# Patient Record
Sex: Male | Born: 1940 | Race: White | Hispanic: No | Marital: Married | Smoking: Never smoker
Health system: Southern US, Community
[De-identification: ages and names within clinical notes are randomized; demographics above are authoritative.]

## PROBLEM LIST (undated history)

## (undated) DIAGNOSIS — I2510 Atherosclerotic heart disease of native coronary artery without angina pectoris: Secondary | ICD-10-CM

## (undated) DIAGNOSIS — E7800 Pure hypercholesterolemia, unspecified: Secondary | ICD-10-CM

## (undated) DIAGNOSIS — E119 Type 2 diabetes mellitus without complications: Secondary | ICD-10-CM

## (undated) DIAGNOSIS — I10 Essential (primary) hypertension: Secondary | ICD-10-CM

## (undated) HISTORY — PX: TONSILLECTOMY: SUR1361

## (undated) HISTORY — PX: OTHER SURGICAL HISTORY: SHX169

---

## 2014-10-19 DIAGNOSIS — I10 Essential (primary) hypertension: Secondary | ICD-10-CM | POA: Diagnosis not present

## 2014-10-19 DIAGNOSIS — I2510 Atherosclerotic heart disease of native coronary artery without angina pectoris: Secondary | ICD-10-CM | POA: Diagnosis not present

## 2014-10-19 DIAGNOSIS — E785 Hyperlipidemia, unspecified: Secondary | ICD-10-CM | POA: Diagnosis not present

## 2014-10-19 DIAGNOSIS — E119 Type 2 diabetes mellitus without complications: Secondary | ICD-10-CM | POA: Diagnosis not present

## 2014-10-19 DIAGNOSIS — E1140 Type 2 diabetes mellitus with diabetic neuropathy, unspecified: Secondary | ICD-10-CM | POA: Diagnosis not present

## 2014-10-19 DIAGNOSIS — Z23 Encounter for immunization: Secondary | ICD-10-CM | POA: Diagnosis not present

## 2015-04-09 DIAGNOSIS — I517 Cardiomegaly: Secondary | ICD-10-CM | POA: Diagnosis not present

## 2015-04-09 DIAGNOSIS — I081 Rheumatic disorders of both mitral and tricuspid valves: Secondary | ICD-10-CM | POA: Diagnosis not present

## 2015-04-09 DIAGNOSIS — R0602 Shortness of breath: Secondary | ICD-10-CM | POA: Diagnosis not present

## 2015-04-09 DIAGNOSIS — E780 Pure hypercholesterolemia: Secondary | ICD-10-CM | POA: Diagnosis not present

## 2015-04-09 DIAGNOSIS — E785 Hyperlipidemia, unspecified: Secondary | ICD-10-CM | POA: Diagnosis not present

## 2015-04-09 DIAGNOSIS — I2510 Atherosclerotic heart disease of native coronary artery without angina pectoris: Secondary | ICD-10-CM | POA: Diagnosis not present

## 2015-04-16 DIAGNOSIS — E1140 Type 2 diabetes mellitus with diabetic neuropathy, unspecified: Secondary | ICD-10-CM | POA: Diagnosis not present

## 2015-04-16 DIAGNOSIS — Z951 Presence of aortocoronary bypass graft: Secondary | ICD-10-CM | POA: Diagnosis not present

## 2015-04-16 DIAGNOSIS — I10 Essential (primary) hypertension: Secondary | ICD-10-CM | POA: Diagnosis not present

## 2015-04-16 DIAGNOSIS — I2510 Atherosclerotic heart disease of native coronary artery without angina pectoris: Secondary | ICD-10-CM | POA: Diagnosis not present

## 2015-04-16 DIAGNOSIS — E669 Obesity, unspecified: Secondary | ICD-10-CM | POA: Diagnosis not present

## 2015-04-17 DIAGNOSIS — Z961 Presence of intraocular lens: Secondary | ICD-10-CM | POA: Diagnosis not present

## 2015-04-17 DIAGNOSIS — H01022 Squamous blepharitis right lower eyelid: Secondary | ICD-10-CM | POA: Diagnosis not present

## 2015-04-17 DIAGNOSIS — H01025 Squamous blepharitis left lower eyelid: Secondary | ICD-10-CM | POA: Diagnosis not present

## 2015-04-17 DIAGNOSIS — E119 Type 2 diabetes mellitus without complications: Secondary | ICD-10-CM | POA: Diagnosis not present

## 2015-06-17 DIAGNOSIS — Z1211 Encounter for screening for malignant neoplasm of colon: Secondary | ICD-10-CM | POA: Diagnosis not present

## 2015-07-25 DIAGNOSIS — Z23 Encounter for immunization: Secondary | ICD-10-CM | POA: Diagnosis not present

## 2015-07-25 DIAGNOSIS — E785 Hyperlipidemia, unspecified: Secondary | ICD-10-CM | POA: Diagnosis not present

## 2015-07-25 DIAGNOSIS — I10 Essential (primary) hypertension: Secondary | ICD-10-CM | POA: Diagnosis not present

## 2015-07-25 DIAGNOSIS — I2510 Atherosclerotic heart disease of native coronary artery without angina pectoris: Secondary | ICD-10-CM | POA: Diagnosis not present

## 2015-07-25 DIAGNOSIS — E119 Type 2 diabetes mellitus without complications: Secondary | ICD-10-CM | POA: Diagnosis not present

## 2015-11-26 DIAGNOSIS — I10 Essential (primary) hypertension: Secondary | ICD-10-CM | POA: Diagnosis not present

## 2015-11-26 DIAGNOSIS — E785 Hyperlipidemia, unspecified: Secondary | ICD-10-CM | POA: Diagnosis not present

## 2015-11-26 DIAGNOSIS — I2510 Atherosclerotic heart disease of native coronary artery without angina pectoris: Secondary | ICD-10-CM | POA: Diagnosis not present

## 2015-11-26 DIAGNOSIS — E119 Type 2 diabetes mellitus without complications: Secondary | ICD-10-CM | POA: Diagnosis not present

## 2016-03-23 DIAGNOSIS — I2510 Atherosclerotic heart disease of native coronary artery without angina pectoris: Secondary | ICD-10-CM | POA: Diagnosis not present

## 2016-03-23 DIAGNOSIS — E1140 Type 2 diabetes mellitus with diabetic neuropathy, unspecified: Secondary | ICD-10-CM | POA: Diagnosis not present

## 2016-03-23 DIAGNOSIS — E785 Hyperlipidemia, unspecified: Secondary | ICD-10-CM | POA: Diagnosis not present

## 2016-03-23 DIAGNOSIS — E119 Type 2 diabetes mellitus without complications: Secondary | ICD-10-CM | POA: Diagnosis not present

## 2016-03-23 DIAGNOSIS — I10 Essential (primary) hypertension: Secondary | ICD-10-CM | POA: Diagnosis not present

## 2016-04-24 DIAGNOSIS — I088 Other rheumatic multiple valve diseases: Secondary | ICD-10-CM | POA: Diagnosis not present

## 2016-04-24 DIAGNOSIS — E785 Hyperlipidemia, unspecified: Secondary | ICD-10-CM | POA: Diagnosis not present

## 2016-04-24 DIAGNOSIS — Q211 Atrial septal defect: Secondary | ICD-10-CM | POA: Diagnosis not present

## 2016-04-24 DIAGNOSIS — I517 Cardiomegaly: Secondary | ICD-10-CM | POA: Diagnosis not present

## 2016-04-24 DIAGNOSIS — I2510 Atherosclerotic heart disease of native coronary artery without angina pectoris: Secondary | ICD-10-CM | POA: Diagnosis not present

## 2016-04-24 DIAGNOSIS — I10 Essential (primary) hypertension: Secondary | ICD-10-CM | POA: Diagnosis not present

## 2016-05-13 DIAGNOSIS — S233XXA Sprain of ligaments of thoracic spine, initial encounter: Secondary | ICD-10-CM | POA: Diagnosis not present

## 2016-09-04 DIAGNOSIS — Z0000 Encounter for general adult medical examination without abnormal findings: Secondary | ICD-10-CM | POA: Diagnosis not present

## 2016-09-04 DIAGNOSIS — E1142 Type 2 diabetes mellitus with diabetic polyneuropathy: Secondary | ICD-10-CM | POA: Diagnosis not present

## 2016-09-04 DIAGNOSIS — Z794 Long term (current) use of insulin: Secondary | ICD-10-CM | POA: Diagnosis not present

## 2016-09-04 DIAGNOSIS — E7800 Pure hypercholesterolemia, unspecified: Secondary | ICD-10-CM | POA: Diagnosis not present

## 2016-09-04 DIAGNOSIS — I2510 Atherosclerotic heart disease of native coronary artery without angina pectoris: Secondary | ICD-10-CM | POA: Diagnosis not present

## 2016-09-04 DIAGNOSIS — I10 Essential (primary) hypertension: Secondary | ICD-10-CM | POA: Diagnosis not present

## 2016-11-18 DIAGNOSIS — R1030 Lower abdominal pain, unspecified: Secondary | ICD-10-CM | POA: Diagnosis not present

## 2016-11-18 DIAGNOSIS — N451 Epididymitis: Secondary | ICD-10-CM | POA: Diagnosis not present

## 2016-12-21 DIAGNOSIS — N451 Epididymitis: Secondary | ICD-10-CM | POA: Diagnosis not present

## 2016-12-23 DIAGNOSIS — N509 Disorder of male genital organs, unspecified: Secondary | ICD-10-CM | POA: Diagnosis not present

## 2017-03-04 DIAGNOSIS — E7800 Pure hypercholesterolemia, unspecified: Secondary | ICD-10-CM | POA: Diagnosis not present

## 2017-03-04 DIAGNOSIS — Z7984 Long term (current) use of oral hypoglycemic drugs: Secondary | ICD-10-CM | POA: Diagnosis not present

## 2017-03-04 DIAGNOSIS — I10 Essential (primary) hypertension: Secondary | ICD-10-CM | POA: Diagnosis not present

## 2017-03-04 DIAGNOSIS — E1142 Type 2 diabetes mellitus with diabetic polyneuropathy: Secondary | ICD-10-CM | POA: Diagnosis not present

## 2017-03-04 DIAGNOSIS — I2510 Atherosclerotic heart disease of native coronary artery without angina pectoris: Secondary | ICD-10-CM | POA: Diagnosis not present

## 2017-04-23 DIAGNOSIS — E785 Hyperlipidemia, unspecified: Secondary | ICD-10-CM | POA: Diagnosis not present

## 2017-04-23 DIAGNOSIS — E1142 Type 2 diabetes mellitus with diabetic polyneuropathy: Secondary | ICD-10-CM | POA: Diagnosis not present

## 2017-04-23 DIAGNOSIS — E668 Other obesity: Secondary | ICD-10-CM | POA: Diagnosis not present

## 2017-04-23 DIAGNOSIS — I2510 Atherosclerotic heart disease of native coronary artery without angina pectoris: Secondary | ICD-10-CM | POA: Diagnosis not present

## 2017-04-23 DIAGNOSIS — I10 Essential (primary) hypertension: Secondary | ICD-10-CM | POA: Diagnosis not present

## 2017-04-23 DIAGNOSIS — Z951 Presence of aortocoronary bypass graft: Secondary | ICD-10-CM | POA: Diagnosis not present

## 2017-06-15 DIAGNOSIS — I10 Essential (primary) hypertension: Secondary | ICD-10-CM | POA: Diagnosis not present

## 2017-06-15 DIAGNOSIS — E7800 Pure hypercholesterolemia, unspecified: Secondary | ICD-10-CM | POA: Diagnosis not present

## 2017-06-15 DIAGNOSIS — N183 Chronic kidney disease, stage 3 (moderate): Secondary | ICD-10-CM | POA: Diagnosis not present

## 2017-06-15 DIAGNOSIS — Z7984 Long term (current) use of oral hypoglycemic drugs: Secondary | ICD-10-CM | POA: Diagnosis not present

## 2017-06-15 DIAGNOSIS — I2510 Atherosclerotic heart disease of native coronary artery without angina pectoris: Secondary | ICD-10-CM | POA: Diagnosis not present

## 2017-06-15 DIAGNOSIS — E1142 Type 2 diabetes mellitus with diabetic polyneuropathy: Secondary | ICD-10-CM | POA: Diagnosis not present

## 2017-09-10 DIAGNOSIS — N183 Chronic kidney disease, stage 3 (moderate): Secondary | ICD-10-CM | POA: Diagnosis not present

## 2017-09-10 DIAGNOSIS — E7800 Pure hypercholesterolemia, unspecified: Secondary | ICD-10-CM | POA: Diagnosis not present

## 2017-09-10 DIAGNOSIS — I10 Essential (primary) hypertension: Secondary | ICD-10-CM | POA: Diagnosis not present

## 2017-09-10 DIAGNOSIS — E1142 Type 2 diabetes mellitus with diabetic polyneuropathy: Secondary | ICD-10-CM | POA: Diagnosis not present

## 2017-09-10 DIAGNOSIS — I2510 Atherosclerotic heart disease of native coronary artery without angina pectoris: Secondary | ICD-10-CM | POA: Diagnosis not present

## 2017-10-25 DIAGNOSIS — Z951 Presence of aortocoronary bypass graft: Secondary | ICD-10-CM | POA: Diagnosis not present

## 2017-10-25 DIAGNOSIS — E785 Hyperlipidemia, unspecified: Secondary | ICD-10-CM | POA: Diagnosis not present

## 2017-10-25 DIAGNOSIS — I119 Hypertensive heart disease without heart failure: Secondary | ICD-10-CM | POA: Diagnosis not present

## 2017-10-25 DIAGNOSIS — E668 Other obesity: Secondary | ICD-10-CM | POA: Diagnosis not present

## 2017-10-25 DIAGNOSIS — I2510 Atherosclerotic heart disease of native coronary artery without angina pectoris: Secondary | ICD-10-CM | POA: Diagnosis not present

## 2017-10-25 DIAGNOSIS — E1142 Type 2 diabetes mellitus with diabetic polyneuropathy: Secondary | ICD-10-CM | POA: Diagnosis not present

## 2017-11-15 DIAGNOSIS — I10 Essential (primary) hypertension: Secondary | ICD-10-CM | POA: Diagnosis not present

## 2017-11-15 DIAGNOSIS — R634 Abnormal weight loss: Secondary | ICD-10-CM | POA: Diagnosis not present

## 2017-11-15 DIAGNOSIS — E1142 Type 2 diabetes mellitus with diabetic polyneuropathy: Secondary | ICD-10-CM | POA: Diagnosis not present

## 2017-12-23 DIAGNOSIS — E7800 Pure hypercholesterolemia, unspecified: Secondary | ICD-10-CM | POA: Diagnosis not present

## 2017-12-23 DIAGNOSIS — I2510 Atherosclerotic heart disease of native coronary artery without angina pectoris: Secondary | ICD-10-CM | POA: Diagnosis not present

## 2017-12-23 DIAGNOSIS — I10 Essential (primary) hypertension: Secondary | ICD-10-CM | POA: Diagnosis not present

## 2017-12-23 DIAGNOSIS — N183 Chronic kidney disease, stage 3 (moderate): Secondary | ICD-10-CM | POA: Diagnosis not present

## 2017-12-23 DIAGNOSIS — E1142 Type 2 diabetes mellitus with diabetic polyneuropathy: Secondary | ICD-10-CM | POA: Diagnosis not present

## 2018-03-14 ENCOUNTER — Emergency Department (HOSPITAL_BASED_OUTPATIENT_CLINIC_OR_DEPARTMENT_OTHER)
Admission: EM | Admit: 2018-03-14 | Discharge: 2018-03-14 | Disposition: A | Discharge status: Home / Self Care | Payer: Medicare Other | Attending: Emergency Medicine | Admitting: Emergency Medicine

## 2018-03-14 ENCOUNTER — Other Ambulatory Visit: Payer: Self-pay

## 2018-03-14 ENCOUNTER — Emergency Department (HOSPITAL_BASED_OUTPATIENT_CLINIC_OR_DEPARTMENT_OTHER): Payer: Medicare Other

## 2018-03-14 ENCOUNTER — Encounter (HOSPITAL_BASED_OUTPATIENT_CLINIC_OR_DEPARTMENT_OTHER): Payer: Self-pay | Admitting: *Deleted

## 2018-03-14 DIAGNOSIS — Z7982 Long term (current) use of aspirin: Secondary | ICD-10-CM | POA: Insufficient documentation

## 2018-03-14 DIAGNOSIS — E119 Type 2 diabetes mellitus without complications: Secondary | ICD-10-CM | POA: Diagnosis not present

## 2018-03-14 DIAGNOSIS — I10 Essential (primary) hypertension: Secondary | ICD-10-CM | POA: Insufficient documentation

## 2018-03-14 DIAGNOSIS — Z794 Long term (current) use of insulin: Secondary | ICD-10-CM | POA: Diagnosis not present

## 2018-03-14 DIAGNOSIS — M76891 Other specified enthesopathies of right lower limb, excluding foot: Secondary | ICD-10-CM | POA: Insufficient documentation

## 2018-03-14 DIAGNOSIS — I2510 Atherosclerotic heart disease of native coronary artery without angina pectoris: Secondary | ICD-10-CM | POA: Insufficient documentation

## 2018-03-14 DIAGNOSIS — R1031 Right lower quadrant pain: Secondary | ICD-10-CM | POA: Diagnosis present

## 2018-03-14 DIAGNOSIS — M25551 Pain in right hip: Secondary | ICD-10-CM | POA: Diagnosis not present

## 2018-03-14 DIAGNOSIS — Z79899 Other long term (current) drug therapy: Secondary | ICD-10-CM | POA: Insufficient documentation

## 2018-03-14 HISTORY — DX: Pure hypercholesterolemia, unspecified: E78.00

## 2018-03-14 HISTORY — DX: Type 2 diabetes mellitus without complications: E11.9

## 2018-03-14 HISTORY — DX: Essential (primary) hypertension: I10

## 2018-03-14 HISTORY — DX: Atherosclerotic heart disease of native coronary artery without angina pectoris: I25.10

## 2018-03-14 MED ORDER — DEXAMETHASONE SODIUM PHOSPHATE 10 MG/ML IJ SOLN
10.0000 mg | Freq: Once | INTRAMUSCULAR | Status: AC
  Administered 2018-03-14: 10 mg via INTRAMUSCULAR
  Filled 2018-03-14: qty 1

## 2018-03-14 MED ORDER — CELECOXIB 200 MG PO CAPS: 200.0000 mg | ORAL_CAPSULE | Freq: Two times a day (BID) | ORAL | 0 refills | Status: AC

## 2018-03-14 NOTE — ED Provider Notes (Signed)
[facility] EMERGENCY DEPARTMENT Provider Note   CSN: [id number] Arrival date & time: 03/14/18  1104     History   Chief Complaint No chief complaint on file.   HPI Ryan Bridges is a 77 y.o. male.  Pt presents to the ED today with right groin pain.  Pt said sx started after swimming about 1 week ago.  The pt said it hurts when he stands or lifts up his leg.  He said it hurts to go down stairs.  He denies seeing a bulge or lump in his groin.  The pt denies n/v or bowel issues.     Past Medical History:  Diagnosis Date  . Coronary artery disease   . Diabetes mellitus without complication ([place])   . High cholesterol   . Hypertension     There are no active problems to display for this patient.   Past Surgical History:  Procedure Laterality Date  . cardiac bypass    . TONSILLECTOMY          Home Medications    Prior to Admission medications   Medication Sig Start Date End Date Taking? Authorizing Provider  aspirin EC 81 MG tablet Take 81 mg by mouth daily.   Yes [provider]  Empagliflozin (JARDIANCE PO) Take by mouth.   Yes [provider]  insulin detemir (LEVEMIR) 100 UNIT/ML injection Inject into the skin daily.   Yes [provider]  metFORMIN (GLUCOPHAGE) 1000 MG tablet Take 1,000 mg by mouth 2 (two) times daily with a meal.   Yes [provider]  metoprolol succinate (TOPROL-XL) 12.5 mg TB24 24 hr tablet Take 12.5 mg by mouth daily.   Yes [provider]  Rosuvastatin Calcium (CRESTOR PO) Take by mouth.   Yes [provider]  Semaglutide (OZEMPIC [place]) Inject into the skin.   Yes [provider]  celecoxib (CELEBREX) 200 MG capsule Take 1 capsule (200 mg total) by mouth 2 (two) times daily. 03/14/18   Isla Pence, MD    Family History No family history on file.  Social History Social History   Tobacco Use  . Smoking status: Never Smoker  . Smokeless tobacco: Never Used    Substance Use Topics  . Alcohol use: Never    Frequency: Never  . Drug use: Never     Allergies   Benadryl [diphenhydramine]   Review of Systems Review of Systems  Gastrointestinal:       Right groin pain  All other systems reviewed and are negative.    Physical Exam Updated Vital Signs BP 135/72   Pulse 74   Temp 98 F (36.7 C) (Oral)   Resp 18   Ht 5\' 11"  (1.803 m)   Wt 102.1 kg (225 lb)   SpO2 96%   BMI 31.38 kg/m   Physical Exam  Constitutional: He is oriented to person, place, and time. He appears well-developed and well-nourished.  HENT:  Head: Normocephalic and atraumatic.  Right Ear: External ear normal.  Left Ear: External ear normal.  Nose: Nose normal.  Mouth/Throat: Oropharynx is clear and moist.  Eyes: Pupils are equal, round, and reactive to light. Conjunctivae and EOM are normal.  Neck: Normal range of motion. Neck supple.  Cardiovascular: Normal rate, regular rhythm, normal heart sounds and intact distal pulses.  Pulmonary/Chest: Effort normal and breath sounds normal.  Abdominal: Soft. Bowel sounds are normal. Hernia confirmed negative in the right inguinal area and confirmed negative in the left inguinal area.  Genitourinary: Testes normal and penis normal. Cremasteric reflex is present. Circumcised.     Genitourinary Comments: Tenderness in the hip flexors  Musculoskeletal: Normal range of motion.  Neurological: He is alert and oriented to person, place, and time.  Skin: Skin is warm. Capillary refill takes less than 2 seconds.  Psychiatric: He has a normal mood and affect. His behavior is normal. Judgment and thought content normal.  Nursing note and vitals reviewed.    ED Treatments / Results  Labs (all labs ordered are listed, but only abnormal results are displayed) Labs Reviewed - No data to display  EKG None  Radiology Dg Hip Unilat W Or Wo Pelvis 2-3 Views Right  Result Date: 03/14/2018 CLINICAL DATA:  Right hip pain  following swimming EXAM: DG HIP (WITH OR WITHOUT PELVIS) 3V RIGHT COMPARISON:  None. FINDINGS: The pelvic ring is intact. No acute fracture or dislocation is noted. Mild degenerative changes of the hip joints are noted. No soft tissue abnormality is seen. IMPRESSION: No acute abnormality noted. Electronically Signed   By: Inez Catalina M.D.   On: 03/14/2018 11:50    Procedures Procedures (including critical care time)  Medications Ordered in ED Medications  dexamethasone (DECADRON) injection 10 mg (has no administration in time range)     Initial Impression / Assessment and Plan / ED Course  I have reviewed the triage vital signs and the nursing notes.  Pertinent labs & imaging results that were available during my care of the patient were reviewed by me and considered in my medical decision making (see chart for details).    Pt likely has hip flexor tendonitis from the up and down motion swimming.  The pt will be put on celebrex and is given rehab exercises until he can get into see Dr. Barbaraann Barthel (sports medicine).  Return if worse.  Final Clinical Impressions(s) / ED Diagnoses   Final diagnoses:  Hip flexor tendonitis, right    ED Discharge Orders        Ordered    celecoxib (CELEBREX) 200 MG capsule  2 times daily     03/14/18 1157       Isla Pence, MD 03/14/18 1202

## 2018-03-14 NOTE — ED Triage Notes (Signed)
Right groin pain x 1 week. States it started after going into a swimming pool.

## 2018-03-28 ENCOUNTER — Ambulatory Visit (INDEPENDENT_AMBULATORY_CARE_PROVIDER_SITE_OTHER): Payer: Medicare Other | Admitting: Family Medicine

## 2018-03-28 ENCOUNTER — Encounter: Payer: Self-pay | Admitting: Family Medicine

## 2018-03-28 VITALS — BP 154/71 | HR 77 | Ht 71.0 in | Wt 222.0 lb

## 2018-03-28 DIAGNOSIS — M1611 Unilateral primary osteoarthritis, right hip: Secondary | ICD-10-CM

## 2018-03-28 NOTE — Patient Instructions (Signed)
Your pain is due to arthritis. These are the different medications you can take for this: Tylenol 500mg  1-2 tabs three times a day for pain. Aspercreme or biofreeze topically up to four times a day may also help with pain. Some supplements that may help for arthritis: Boswellia extract, curcumin, pycnogenol Aleve 1-2 tabs twice a day with food - for 7-10 days then as needed. Cortisone injections are an option - let us know if you want to do this. It's important that you continue to stay active. Hip exercises 3 sets of 10 once a day (add ankle weight if these become too easy). Consider physical therapy to strengthen muscles around the joint that hurts to take pressure off of the joint itself. Shoe inserts with good arch support may be helpful. Heat or ice 15 minutes at a time 3-4 times a day as needed to help with pain. Water aerobics and cycling with low resistance are the best two types of exercise for arthritis though any exercise is ok as long as it doesn't worsen the pain. Follow up with me in 1 month.

## 2018-03-28 NOTE — Progress Notes (Signed)
PCP: Shirline Frees, MD  Subjective:   HPI: Patient is a 77 y.o. male here for right hip pain for the past month.  He states about a month ago he notices pain after swimming.  He was evaluated in the emergency department a little bit after this due to concern of possible hernia.  No hernia was found at that time and he was diagnosed with hip flexor tendinitis.  Since his initial onset of pain he notes 80% improvement.  Currently his pain is a 4/10 and radiates to the groin.  It can be as bad as 8/10 in the morning.  In general this is worse with movement.  He has tried many different treatments to include massage stool, TENS unit, tumor, ginger, ibuprofen 800 mg nightly.  It does wake him up at night.  He denies any masses, numbness, tingling, weakness.  No fevers or chills.  Past Medical History:  Diagnosis Date  . Coronary artery disease   . Diabetes mellitus without complication ([place])   . High cholesterol   . Hypertension     Current Outpatient Medications on File Prior to Visit  Medication Sig Dispense Refill  . aspirin EC 81 MG tablet Take 81 mg by mouth daily.    . celecoxib (CELEBREX) 200 MG capsule Take 1 capsule (200 mg total) by mouth 2 (two) times daily. 60 capsule 0  . Empagliflozin (JARDIANCE PO) Take by mouth.    . insulin detemir (LEVEMIR) 100 UNIT/ML injection Inject into the skin daily.    . metFORMIN (GLUCOPHAGE) 1000 MG tablet Take 1,000 mg by mouth 2 (two) times daily with a meal.    . metoprolol succinate (TOPROL-XL) 12.5 mg TB24 24 hr tablet Take 12.5 mg by mouth daily.    . Rosuvastatin Calcium (CRESTOR PO) Take by mouth.    . Semaglutide (OZEMPIC [place]) Inject into the skin.     No current facility-administered medications on file prior to visit.     Past Surgical History:  Procedure Laterality Date  . cardiac bypass    . TONSILLECTOMY      Allergies  Allergen Reactions  . Benadryl [Diphenhydramine]     rash    Social History   Socioeconomic History   . Marital status: Married    Spouse name: Not on file  . Number of children: Not on file  . Years of education: Not on file  . Highest education level: Not on file  Occupational History  . Not on file  Social Needs  . Financial resource strain: Not on file  . Food insecurity:    Worry: Not on file    Inability: Not on file  . Transportation needs:    Medical: Not on file    Non-medical: Not on file  Tobacco Use  . Smoking status: Never Smoker  . Smokeless tobacco: Never Used  Substance and Sexual Activity  . Alcohol use: Never    Frequency: Never  . Drug use: Never  . Sexual activity: Not on file  Lifestyle  . Physical activity:    Days per week: Not on file    Minutes per session: Not on file  . Stress: Not on file  Relationships  . Social connections:    Talks on phone: Not on file    Gets together: Not on file    Attends religious service: Not on file    Active member of club or organization: Not on file    Attends meetings of clubs or organizations: Not  on file    Relationship status: Not on file  . Intimate partner violence:    Fear of current or ex partner: Not on file    Emotionally abused: Not on file    Physically abused: Not on file    Forced sexual activity: Not on file  Other Topics Concern  . Not on file  Social History Narrative  . Not on file    No family history on file.  BP (!) 154/71   Pulse 77   Ht 5\' 11"  (1.803 m)   Wt 222 lb (100.7 kg)   BMI 30.96 kg/m   Review of Systems: See HPI above.     Objective:  Physical Exam:  Gen: awake, alert, NAD, comfortable in exam room Pulm: breathing unlabored  Right Hip:  - Inspection: No gross deformity - Palpation: No TTP - ROM: Mild limitation IR.  Otherwise normal range of motion on Flexion, extension, abduction, adduction - Strength: Normal strength.  - Neuro/vasc: NV intact - Special Tests: Negative FABER. Mild anterior pain with FADIR. Pain with Logroll  Left Hip:  - Inspection:  No gross deformity - Palpation: No TTP - ROM: Mild limitation IR.  Otherwise normal range of motion on Flexion, extension, abduction, adduction - Strength: Normal strength.  - Neuro/Vasc: NV intact distally - Special Tests: Negative FABER. Negative FADIR, Negative Logroll   Assessment & Plan:  1.  Right hip osteoarthritis: Patient has anterior hip pain with radiation to the groin and pain with passive motion of the hip.  This is most consistent with arthritis.  His previous x-rays were independently reviewed today which show mild degenerative changes in the hip.  Overall he is doing well and gradually improving since onset. - Recommend Aleve 2 tablets twice daily.  Tylenol 2 extra strength tablets every 8 hours for pain - We will provide with home exercises for strengthening. - Recommended use of bike and/or walking for exercise - Discussed steroid injection in the hip if his pain persists - Follow-up in 4 weeks

## 2018-04-01 NOTE — Addendum Note (Signed)
Addended by: Sherrie George F on: 04/01/2018 11:40 AM   Modules accepted: Orders

## 2018-04-04 DIAGNOSIS — M9905 Segmental and somatic dysfunction of pelvic region: Secondary | ICD-10-CM | POA: Diagnosis not present

## 2018-04-04 DIAGNOSIS — M5134 Other intervertebral disc degeneration, thoracic region: Secondary | ICD-10-CM | POA: Diagnosis not present

## 2018-04-04 DIAGNOSIS — M5136 Other intervertebral disc degeneration, lumbar region: Secondary | ICD-10-CM | POA: Diagnosis not present

## 2018-04-04 DIAGNOSIS — M9902 Segmental and somatic dysfunction of thoracic region: Secondary | ICD-10-CM | POA: Diagnosis not present

## 2018-04-04 DIAGNOSIS — M5432 Sciatica, left side: Secondary | ICD-10-CM | POA: Diagnosis not present

## 2018-04-04 DIAGNOSIS — M9903 Segmental and somatic dysfunction of lumbar region: Secondary | ICD-10-CM | POA: Diagnosis not present

## 2018-04-05 DIAGNOSIS — M5432 Sciatica, left side: Secondary | ICD-10-CM | POA: Diagnosis not present

## 2018-04-05 DIAGNOSIS — M5134 Other intervertebral disc degeneration, thoracic region: Secondary | ICD-10-CM | POA: Diagnosis not present

## 2018-04-05 DIAGNOSIS — M5136 Other intervertebral disc degeneration, lumbar region: Secondary | ICD-10-CM | POA: Diagnosis not present

## 2018-04-05 DIAGNOSIS — M9905 Segmental and somatic dysfunction of pelvic region: Secondary | ICD-10-CM | POA: Diagnosis not present

## 2018-04-05 DIAGNOSIS — M9903 Segmental and somatic dysfunction of lumbar region: Secondary | ICD-10-CM | POA: Diagnosis not present

## 2018-04-05 DIAGNOSIS — M9902 Segmental and somatic dysfunction of thoracic region: Secondary | ICD-10-CM | POA: Diagnosis not present

## 2018-04-07 DIAGNOSIS — M5136 Other intervertebral disc degeneration, lumbar region: Secondary | ICD-10-CM | POA: Diagnosis not present

## 2018-04-07 DIAGNOSIS — M5134 Other intervertebral disc degeneration, thoracic region: Secondary | ICD-10-CM | POA: Diagnosis not present

## 2018-04-07 DIAGNOSIS — M9903 Segmental and somatic dysfunction of lumbar region: Secondary | ICD-10-CM | POA: Diagnosis not present

## 2018-04-07 DIAGNOSIS — M9905 Segmental and somatic dysfunction of pelvic region: Secondary | ICD-10-CM | POA: Diagnosis not present

## 2018-04-07 DIAGNOSIS — M9902 Segmental and somatic dysfunction of thoracic region: Secondary | ICD-10-CM | POA: Diagnosis not present

## 2018-04-07 DIAGNOSIS — M5432 Sciatica, left side: Secondary | ICD-10-CM | POA: Diagnosis not present

## 2018-04-11 DIAGNOSIS — M5432 Sciatica, left side: Secondary | ICD-10-CM | POA: Diagnosis not present

## 2018-04-11 DIAGNOSIS — M5136 Other intervertebral disc degeneration, lumbar region: Secondary | ICD-10-CM | POA: Diagnosis not present

## 2018-04-11 DIAGNOSIS — M5134 Other intervertebral disc degeneration, thoracic region: Secondary | ICD-10-CM | POA: Diagnosis not present

## 2018-04-11 DIAGNOSIS — M9902 Segmental and somatic dysfunction of thoracic region: Secondary | ICD-10-CM | POA: Diagnosis not present

## 2018-04-11 DIAGNOSIS — M9903 Segmental and somatic dysfunction of lumbar region: Secondary | ICD-10-CM | POA: Diagnosis not present

## 2018-04-11 DIAGNOSIS — M9905 Segmental and somatic dysfunction of pelvic region: Secondary | ICD-10-CM | POA: Diagnosis not present

## 2018-04-13 DIAGNOSIS — M5136 Other intervertebral disc degeneration, lumbar region: Secondary | ICD-10-CM | POA: Diagnosis not present

## 2018-04-13 DIAGNOSIS — M5432 Sciatica, left side: Secondary | ICD-10-CM | POA: Diagnosis not present

## 2018-04-13 DIAGNOSIS — M9903 Segmental and somatic dysfunction of lumbar region: Secondary | ICD-10-CM | POA: Diagnosis not present

## 2018-04-13 DIAGNOSIS — M5134 Other intervertebral disc degeneration, thoracic region: Secondary | ICD-10-CM | POA: Diagnosis not present

## 2018-04-13 DIAGNOSIS — M9902 Segmental and somatic dysfunction of thoracic region: Secondary | ICD-10-CM | POA: Diagnosis not present

## 2018-04-13 DIAGNOSIS — M9905 Segmental and somatic dysfunction of pelvic region: Secondary | ICD-10-CM | POA: Diagnosis not present

## 2018-04-19 DIAGNOSIS — M5432 Sciatica, left side: Secondary | ICD-10-CM | POA: Diagnosis not present

## 2018-04-19 DIAGNOSIS — M5134 Other intervertebral disc degeneration, thoracic region: Secondary | ICD-10-CM | POA: Diagnosis not present

## 2018-04-19 DIAGNOSIS — M9903 Segmental and somatic dysfunction of lumbar region: Secondary | ICD-10-CM | POA: Diagnosis not present

## 2018-04-19 DIAGNOSIS — M9905 Segmental and somatic dysfunction of pelvic region: Secondary | ICD-10-CM | POA: Diagnosis not present

## 2018-04-19 DIAGNOSIS — M5136 Other intervertebral disc degeneration, lumbar region: Secondary | ICD-10-CM | POA: Diagnosis not present

## 2018-04-19 DIAGNOSIS — M9902 Segmental and somatic dysfunction of thoracic region: Secondary | ICD-10-CM | POA: Diagnosis not present

## 2018-04-20 DIAGNOSIS — M2550 Pain in unspecified joint: Secondary | ICD-10-CM | POA: Diagnosis not present

## 2018-04-21 DIAGNOSIS — M5136 Other intervertebral disc degeneration, lumbar region: Secondary | ICD-10-CM | POA: Diagnosis not present

## 2018-04-21 DIAGNOSIS — M9903 Segmental and somatic dysfunction of lumbar region: Secondary | ICD-10-CM | POA: Diagnosis not present

## 2018-04-21 DIAGNOSIS — M5432 Sciatica, left side: Secondary | ICD-10-CM | POA: Diagnosis not present

## 2018-04-21 DIAGNOSIS — M9905 Segmental and somatic dysfunction of pelvic region: Secondary | ICD-10-CM | POA: Diagnosis not present

## 2018-04-21 DIAGNOSIS — M5134 Other intervertebral disc degeneration, thoracic region: Secondary | ICD-10-CM | POA: Diagnosis not present

## 2018-04-21 DIAGNOSIS — M9902 Segmental and somatic dysfunction of thoracic region: Secondary | ICD-10-CM | POA: Diagnosis not present

## 2018-04-26 ENCOUNTER — Ambulatory Visit: Payer: Medicare Other | Admitting: Family Medicine

## 2018-05-02 DIAGNOSIS — M25559 Pain in unspecified hip: Secondary | ICD-10-CM | POA: Diagnosis not present

## 2018-05-02 DIAGNOSIS — M25519 Pain in unspecified shoulder: Secondary | ICD-10-CM | POA: Diagnosis not present

## 2018-05-02 DIAGNOSIS — Z13820 Encounter for screening for osteoporosis: Secondary | ICD-10-CM | POA: Diagnosis not present

## 2018-05-02 DIAGNOSIS — M353 Polymyalgia rheumatica: Secondary | ICD-10-CM | POA: Diagnosis not present

## 2018-05-02 DIAGNOSIS — E119 Type 2 diabetes mellitus without complications: Secondary | ICD-10-CM | POA: Diagnosis not present

## 2018-05-02 DIAGNOSIS — M2560 Stiffness of unspecified joint, not elsewhere classified: Secondary | ICD-10-CM | POA: Diagnosis not present

## 2018-06-13 DIAGNOSIS — E1169 Type 2 diabetes mellitus with other specified complication: Secondary | ICD-10-CM | POA: Diagnosis not present

## 2018-06-13 DIAGNOSIS — E785 Hyperlipidemia, unspecified: Secondary | ICD-10-CM | POA: Diagnosis not present

## 2018-06-13 DIAGNOSIS — M25519 Pain in unspecified shoulder: Secondary | ICD-10-CM | POA: Diagnosis not present

## 2018-06-13 DIAGNOSIS — M25559 Pain in unspecified hip: Secondary | ICD-10-CM | POA: Diagnosis not present

## 2018-06-13 DIAGNOSIS — Z13820 Encounter for screening for osteoporosis: Secondary | ICD-10-CM | POA: Diagnosis not present

## 2018-06-13 DIAGNOSIS — M2560 Stiffness of unspecified joint, not elsewhere classified: Secondary | ICD-10-CM | POA: Diagnosis not present

## 2018-06-13 DIAGNOSIS — M353 Polymyalgia rheumatica: Secondary | ICD-10-CM | POA: Diagnosis not present

## 2018-06-13 DIAGNOSIS — Z7952 Long term (current) use of systemic steroids: Secondary | ICD-10-CM | POA: Diagnosis not present

## 2018-06-27 DIAGNOSIS — E7800 Pure hypercholesterolemia, unspecified: Secondary | ICD-10-CM | POA: Diagnosis not present

## 2018-06-27 DIAGNOSIS — I2510 Atherosclerotic heart disease of native coronary artery without angina pectoris: Secondary | ICD-10-CM | POA: Diagnosis not present

## 2018-06-27 DIAGNOSIS — I10 Essential (primary) hypertension: Secondary | ICD-10-CM | POA: Diagnosis not present

## 2018-06-27 DIAGNOSIS — E1142 Type 2 diabetes mellitus with diabetic polyneuropathy: Secondary | ICD-10-CM | POA: Diagnosis not present

## 2018-06-27 DIAGNOSIS — Z1211 Encounter for screening for malignant neoplasm of colon: Secondary | ICD-10-CM | POA: Diagnosis not present

## 2018-06-27 DIAGNOSIS — N183 Chronic kidney disease, stage 3 (moderate): Secondary | ICD-10-CM | POA: Diagnosis not present

## 2018-06-27 DIAGNOSIS — M353 Polymyalgia rheumatica: Secondary | ICD-10-CM | POA: Diagnosis not present

## 2018-07-05 DIAGNOSIS — M25559 Pain in unspecified hip: Secondary | ICD-10-CM | POA: Diagnosis not present

## 2018-07-05 DIAGNOSIS — Z13820 Encounter for screening for osteoporosis: Secondary | ICD-10-CM | POA: Diagnosis not present

## 2018-07-05 DIAGNOSIS — E1169 Type 2 diabetes mellitus with other specified complication: Secondary | ICD-10-CM | POA: Diagnosis not present

## 2018-07-05 DIAGNOSIS — M25532 Pain in left wrist: Secondary | ICD-10-CM | POA: Diagnosis not present

## 2018-07-05 DIAGNOSIS — Z7952 Long term (current) use of systemic steroids: Secondary | ICD-10-CM | POA: Diagnosis not present

## 2018-07-05 DIAGNOSIS — M25519 Pain in unspecified shoulder: Secondary | ICD-10-CM | POA: Diagnosis not present

## 2018-07-05 DIAGNOSIS — E785 Hyperlipidemia, unspecified: Secondary | ICD-10-CM | POA: Diagnosis not present

## 2018-07-05 DIAGNOSIS — M2560 Stiffness of unspecified joint, not elsewhere classified: Secondary | ICD-10-CM | POA: Diagnosis not present

## 2018-07-05 DIAGNOSIS — M353 Polymyalgia rheumatica: Secondary | ICD-10-CM | POA: Diagnosis not present

## 2018-07-20 DIAGNOSIS — M25532 Pain in left wrist: Secondary | ICD-10-CM | POA: Diagnosis not present

## 2018-07-28 DIAGNOSIS — M25532 Pain in left wrist: Secondary | ICD-10-CM | POA: Diagnosis not present

## 2018-07-28 DIAGNOSIS — G5602 Carpal tunnel syndrome, left upper limb: Secondary | ICD-10-CM | POA: Diagnosis not present

## 2018-09-14 DIAGNOSIS — M2560 Stiffness of unspecified joint, not elsewhere classified: Secondary | ICD-10-CM | POA: Diagnosis not present

## 2018-09-14 DIAGNOSIS — Z13820 Encounter for screening for osteoporosis: Secondary | ICD-10-CM | POA: Diagnosis not present

## 2018-09-14 DIAGNOSIS — E785 Hyperlipidemia, unspecified: Secondary | ICD-10-CM | POA: Diagnosis not present

## 2018-09-14 DIAGNOSIS — E1169 Type 2 diabetes mellitus with other specified complication: Secondary | ICD-10-CM | POA: Diagnosis not present

## 2018-09-14 DIAGNOSIS — M25559 Pain in unspecified hip: Secondary | ICD-10-CM | POA: Diagnosis not present

## 2018-09-14 DIAGNOSIS — M25519 Pain in unspecified shoulder: Secondary | ICD-10-CM | POA: Diagnosis not present

## 2018-09-14 DIAGNOSIS — M25532 Pain in left wrist: Secondary | ICD-10-CM | POA: Diagnosis not present

## 2018-09-14 DIAGNOSIS — M353 Polymyalgia rheumatica: Secondary | ICD-10-CM | POA: Diagnosis not present

## 2018-09-14 DIAGNOSIS — Z7952 Long term (current) use of systemic steroids: Secondary | ICD-10-CM | POA: Diagnosis not present

## 2018-10-06 DIAGNOSIS — M2560 Stiffness of unspecified joint, not elsewhere classified: Secondary | ICD-10-CM | POA: Diagnosis not present

## 2018-10-06 DIAGNOSIS — M19012 Primary osteoarthritis, left shoulder: Secondary | ICD-10-CM | POA: Diagnosis not present

## 2018-10-12 DIAGNOSIS — M25512 Pain in left shoulder: Secondary | ICD-10-CM | POA: Diagnosis not present

## 2018-10-14 ENCOUNTER — Other Ambulatory Visit: Payer: Self-pay | Admitting: Orthopedic Surgery

## 2018-10-17 ENCOUNTER — Other Ambulatory Visit: Payer: Self-pay | Admitting: Orthopedic Surgery

## 2018-10-17 DIAGNOSIS — M25512 Pain in left shoulder: Secondary | ICD-10-CM

## 2018-10-19 ENCOUNTER — Ambulatory Visit
Admission: RE | Admit: 2018-10-19 | Discharge: 2018-10-19 | Disposition: A | Discharge status: Home / Self Care | Payer: Medicare Other | Source: Ambulatory Visit | Attending: Orthopedic Surgery | Admitting: Orthopedic Surgery

## 2018-10-19 DIAGNOSIS — M25512 Pain in left shoulder: Secondary | ICD-10-CM

## 2018-10-19 DIAGNOSIS — D171 Benign lipomatous neoplasm of skin and subcutaneous tissue of trunk: Secondary | ICD-10-CM | POA: Diagnosis not present

## 2018-10-27 DIAGNOSIS — Z139 Encounter for screening, unspecified: Secondary | ICD-10-CM | POA: Diagnosis not present

## 2018-10-27 DIAGNOSIS — E279 Disorder of adrenal gland, unspecified: Secondary | ICD-10-CM | POA: Diagnosis not present

## 2018-10-27 DIAGNOSIS — M25512 Pain in left shoulder: Secondary | ICD-10-CM | POA: Diagnosis not present

## 2018-10-28 DIAGNOSIS — Z125 Encounter for screening for malignant neoplasm of prostate: Secondary | ICD-10-CM | POA: Diagnosis not present

## 2018-10-31 ENCOUNTER — Other Ambulatory Visit: Payer: Self-pay | Admitting: Orthopedic Surgery

## 2018-10-31 DIAGNOSIS — E279 Disorder of adrenal gland, unspecified: Principal | ICD-10-CM

## 2018-10-31 DIAGNOSIS — E278 Other specified disorders of adrenal gland: Secondary | ICD-10-CM

## 2018-11-07 ENCOUNTER — Telehealth: Payer: Self-pay | Admitting: Cardiology

## 2018-11-07 NOTE — Telephone Encounter (Signed)
° °  ° °  Primary Cardiologist:  Revankar  Patient contacted.  History reviewed.  No symptoms to suggest any unstable cardiac conditions.  Based on discussion, with current pandemic situation, we will be postponing this appointment for Ryan Bridges.  If symptoms change, he has been instructed to contact our office.     Calla Kicks  11/07/2018 9:59 AM         .

## 2018-11-09 ENCOUNTER — Ambulatory Visit: Payer: Medicare Other | Admitting: Cardiology

## 2018-11-10 ENCOUNTER — Other Ambulatory Visit: Payer: Medicare Other

## 2018-11-30 ENCOUNTER — Telehealth: Payer: Self-pay | Admitting: Cardiology

## 2018-11-30 NOTE — Telephone Encounter (Signed)
Called patient from the [facility] and left message to call office for possible TELEVSIT with Dr.Revankar.

## 2018-12-22 ENCOUNTER — Other Ambulatory Visit: Payer: Medicare Other

## 2018-12-26 DIAGNOSIS — M353 Polymyalgia rheumatica: Secondary | ICD-10-CM | POA: Diagnosis not present

## 2018-12-26 DIAGNOSIS — E1142 Type 2 diabetes mellitus with diabetic polyneuropathy: Secondary | ICD-10-CM | POA: Diagnosis not present

## 2018-12-26 DIAGNOSIS — I10 Essential (primary) hypertension: Secondary | ICD-10-CM | POA: Diagnosis not present

## 2018-12-26 DIAGNOSIS — Z7984 Long term (current) use of oral hypoglycemic drugs: Secondary | ICD-10-CM | POA: Diagnosis not present

## 2018-12-26 DIAGNOSIS — N183 Chronic kidney disease, stage 3 (moderate): Secondary | ICD-10-CM | POA: Diagnosis not present

## 2018-12-26 DIAGNOSIS — E7800 Pure hypercholesterolemia, unspecified: Secondary | ICD-10-CM | POA: Diagnosis not present

## 2018-12-26 DIAGNOSIS — I2510 Atherosclerotic heart disease of native coronary artery without angina pectoris: Secondary | ICD-10-CM | POA: Diagnosis not present

## 2018-12-26 DIAGNOSIS — Z125 Encounter for screening for malignant neoplasm of prostate: Secondary | ICD-10-CM | POA: Diagnosis not present

## 2019-01-06 DIAGNOSIS — Z794 Long term (current) use of insulin: Secondary | ICD-10-CM | POA: Diagnosis not present

## 2019-01-06 DIAGNOSIS — E1165 Type 2 diabetes mellitus with hyperglycemia: Secondary | ICD-10-CM | POA: Diagnosis not present

## 2019-01-06 DIAGNOSIS — Z125 Encounter for screening for malignant neoplasm of prostate: Secondary | ICD-10-CM | POA: Diagnosis not present

## 2019-01-06 DIAGNOSIS — I10 Essential (primary) hypertension: Secondary | ICD-10-CM | POA: Diagnosis not present

## 2019-01-06 DIAGNOSIS — E1142 Type 2 diabetes mellitus with diabetic polyneuropathy: Secondary | ICD-10-CM | POA: Diagnosis not present

## 2019-01-06 DIAGNOSIS — M353 Polymyalgia rheumatica: Secondary | ICD-10-CM | POA: Diagnosis not present

## 2019-01-06 DIAGNOSIS — E7800 Pure hypercholesterolemia, unspecified: Secondary | ICD-10-CM | POA: Diagnosis not present

## 2019-02-01 ENCOUNTER — Other Ambulatory Visit: Payer: Medicare Other

## 2019-04-27 ENCOUNTER — Emergency Department (HOSPITAL_BASED_OUTPATIENT_CLINIC_OR_DEPARTMENT_OTHER): Payer: Medicare Other

## 2019-04-27 ENCOUNTER — Other Ambulatory Visit: Payer: Self-pay

## 2019-04-27 ENCOUNTER — Emergency Department (HOSPITAL_BASED_OUTPATIENT_CLINIC_OR_DEPARTMENT_OTHER)
Admission: EM | Admit: 2019-04-27 | Discharge: 2019-04-27 | Disposition: A | Discharge status: Home / Self Care | Payer: Medicare Other | Attending: Emergency Medicine | Admitting: Emergency Medicine

## 2019-04-27 ENCOUNTER — Encounter (HOSPITAL_BASED_OUTPATIENT_CLINIC_OR_DEPARTMENT_OTHER): Payer: Self-pay | Admitting: Emergency Medicine

## 2019-04-27 DIAGNOSIS — N200 Calculus of kidney: Secondary | ICD-10-CM | POA: Diagnosis not present

## 2019-04-27 DIAGNOSIS — I10 Essential (primary) hypertension: Secondary | ICD-10-CM | POA: Insufficient documentation

## 2019-04-27 DIAGNOSIS — Z7984 Long term (current) use of oral hypoglycemic drugs: Secondary | ICD-10-CM | POA: Diagnosis not present

## 2019-04-27 DIAGNOSIS — I2510 Atherosclerotic heart disease of native coronary artery without angina pectoris: Secondary | ICD-10-CM | POA: Diagnosis not present

## 2019-04-27 DIAGNOSIS — E782 Mixed hyperlipidemia: Secondary | ICD-10-CM | POA: Insufficient documentation

## 2019-04-27 DIAGNOSIS — R1032 Left lower quadrant pain: Secondary | ICD-10-CM | POA: Diagnosis present

## 2019-04-27 DIAGNOSIS — Z888 Allergy status to other drugs, medicaments and biological substances status: Secondary | ICD-10-CM | POA: Insufficient documentation

## 2019-04-27 DIAGNOSIS — K769 Liver disease, unspecified: Secondary | ICD-10-CM

## 2019-04-27 DIAGNOSIS — E119 Type 2 diabetes mellitus without complications: Secondary | ICD-10-CM | POA: Diagnosis not present

## 2019-04-27 DIAGNOSIS — R932 Abnormal findings on diagnostic imaging of liver and biliary tract: Secondary | ICD-10-CM | POA: Insufficient documentation

## 2019-04-27 DIAGNOSIS — N210 Calculus in bladder: Secondary | ICD-10-CM | POA: Diagnosis not present

## 2019-04-27 DIAGNOSIS — N139 Obstructive and reflux uropathy, unspecified: Secondary | ICD-10-CM | POA: Diagnosis not present

## 2019-04-27 DIAGNOSIS — Z79899 Other long term (current) drug therapy: Secondary | ICD-10-CM | POA: Diagnosis not present

## 2019-04-27 DIAGNOSIS — N132 Hydronephrosis with renal and ureteral calculous obstruction: Secondary | ICD-10-CM | POA: Diagnosis not present

## 2019-04-27 LAB — BASIC METABOLIC PANEL
Anion gap: 10 (ref 5–15)
BUN: 25 mg/dL — ABNORMAL HIGH (ref 8–23)
CO2: 23 mmol/L (ref 22–32)
Calcium: 9.3 mg/dL (ref 8.9–10.3)
Chloride: 108 mmol/L (ref 98–111)
Creatinine, Ser: 1.24 mg/dL (ref 0.61–1.24)
GFR calc Af Amer: >60 mL/min (ref >60)
GFR calc non Af Amer: 56 mL/min — ABNORMAL LOW (ref >60)
Glucose, Bld: 150 mg/dL — ABNORMAL HIGH (ref 70–99)
Potassium: 4.2 mmol/L (ref 3.5–5.1)
Sodium: 141 mmol/L (ref 135–145)

## 2019-04-27 LAB — URINALYSIS, ROUTINE W REFLEX MICROSCOPIC
Bilirubin Urine: NEGATIVE
Glucose, UA: >=500 mg/dL — AB
Ketones, ur: NEGATIVE mg/dL
Leukocytes,Ua: NEGATIVE
Nitrite: NEGATIVE
Protein, ur: NEGATIVE mg/dL
Specific Gravity, Urine: 1.025 (ref 1.005–1.030)
pH: 5.5 (ref 5.0–8.0)

## 2019-04-27 LAB — CBC WITH DIFFERENTIAL/PLATELET
Abs Immature Granulocytes: 0.01 10*3/uL (ref 0.00–0.07)
Basophils Absolute: 0.1 10*3/uL (ref 0.0–0.1)
Basophils Relative: 1 %
Eosinophils Absolute: 0.2 10*3/uL (ref 0.0–0.5)
Eosinophils Relative: 3 %
HCT: 47 % (ref 39.0–52.0)
Hemoglobin: 14.9 g/dL (ref 13.0–17.0)
Immature Granulocytes: 0 %
Lymphocytes Relative: 35 %
Lymphs Abs: 2.6 10*3/uL (ref 0.7–4.0)
MCH: 30.1 pg (ref 26.0–34.0)
MCHC: 31.7 g/dL (ref 30.0–36.0)
MCV: 94.9 fL (ref 80.0–100.0)
Monocytes Absolute: 0.8 10*3/uL (ref 0.1–1.0)
Monocytes Relative: 10 %
Neutro Abs: 3.8 10*3/uL (ref 1.7–7.7)
Neutrophils Relative %: 51 %
Platelets: 269 10*3/uL (ref 150–400)
RBC: 4.95 MIL/uL (ref 4.22–5.81)
RDW: 14.2 % (ref 11.5–15.5)
WBC: 7.5 10*3/uL (ref 4.0–10.5)
nRBC: 0 % (ref 0.0–0.2)

## 2019-04-27 LAB — URINALYSIS, MICROSCOPIC (REFLEX)

## 2019-04-27 MED ORDER — SODIUM CHLORIDE 0.9 % IV BOLUS
500.0000 mL | Freq: Once | INTRAVENOUS | Status: AC
  Administered 2019-04-27: 500 mL via INTRAVENOUS

## 2019-04-27 MED ORDER — HYDROCODONE-ACETAMINOPHEN 5-325 MG PO TABS: 1.0000 | ORAL_TABLET | ORAL | 0 refills | Status: DC | PRN

## 2019-04-27 MED ORDER — FENTANYL CITRATE (PF) 100 MCG/2ML IJ SOLN
50.0000 ug | Freq: Once | INTRAMUSCULAR | Status: AC
  Administered 2019-04-27: 50 ug via INTRAVENOUS
  Filled 2019-04-27: qty 2

## 2019-04-27 NOTE — ED Triage Notes (Signed)
Patient presents with complaints of left flank pain; states woke up this am to use the restroom and noticed pain in left flank; denies NVD.

## 2019-04-27 NOTE — ED Provider Notes (Signed)
[facility] EMERGENCY DEPARTMENT Provider Note   CSN: [id number] Arrival date & time: 04/27/19  0605     History   Chief Complaint Chief Complaint  Patient presents with  . Flank Pain    HPI Ryan Bridges is a 78 y.o. male.     HPI  Is a 78 year old male with a history of coronary artery disease, diabetes, hypertension, high cholesterol who presents with left flank pain.  Onset around 2 AM when he woke up.  He states that he needed to go urinate and had fairly sharp left flank pain.  It was nonradiating.  Denies any nausea or vomiting.  At the time it was 10 out of 10.  Pain has subsided to 2 out of 10.  He has not taken anything for his pain.  Denies any recent fevers, dysuria, hematuria.  No known history of kidney stones.  Denies diarrhea.  Past Medical History:  Diagnosis Date  . Coronary artery disease   . Diabetes mellitus without complication ([place])   . High cholesterol   . Hypertension     There are no active problems to display for this patient.   Past Surgical History:  Procedure Laterality Date  . cardiac bypass    . TONSILLECTOMY          Home Medications    Prior to Admission medications   Medication Sig Start Date End Date Taking? Authorizing Provider  sitaGLIPtin (JANUVIA) 50 MG tablet Take by mouth. 06/30/13  Yes [provider]  aspirin EC 81 MG tablet Take 81 mg by mouth daily.    [provider]  celecoxib (CELEBREX) 200 MG capsule Take 1 capsule (200 mg total) by mouth 2 (two) times daily. 03/14/18   Isla Pence, MD  Empagliflozin (JARDIANCE PO) Take by mouth.    [provider]  insulin detemir (LEVEMIR) 100 UNIT/ML injection Inject into the skin daily.    [provider]  metFORMIN (GLUCOPHAGE) 1000 MG tablet Take 1,000 mg by mouth 2 (two) times daily with a meal.    [provider]  metoprolol succinate (TOPROL-XL) 12.5 mg TB24 24 hr tablet Take 12.5 mg by mouth daily.    [provider]  predniSONE (DELTASONE) 5 MG tablet  04/09/19   [provider]  Rosuvastatin Calcium (CRESTOR PO) Take by mouth.    [provider]  Semaglutide (OZEMPIC [place]) Inject into the skin.    [provider]  Semaglutide,0.25 or 0.5MG /DOS, (OZEMPIC, 0.25 OR 0.5 MG/DOSE,) 2 MG/1.5ML SOPN Ozempic  weekly    [provider]    Family History History reviewed. No pertinent family history.  Social History Social History   Tobacco Use  . Smoking status: Never Smoker  . Smokeless tobacco: Never Used  Substance Use Topics  . Alcohol use: Never    Frequency: Never  . Drug use: Never     Allergies   Benadryl [diphenhydramine]   Review of Systems Review of Systems  Constitutional: Negative for fever.  Respiratory: Negative for shortness of breath.   Cardiovascular: Negative for chest pain.  Gastrointestinal: Negative for abdominal pain and nausea.  Genitourinary: Positive for flank pain. Negative for dysuria and hematuria.  Skin: Negative for rash.  All other systems reviewed and are negative.    Physical Exam Updated Vital Signs BP (!) 152/103 (BP Location: Left Arm)   Pulse 84   Temp (!) 97.5 F (36.4 C) (Oral)   Resp 18   Ht 1.803 m (5\' 11" )  Wt 100 kg   SpO2 98%   BMI 30.75 kg/m   Physical Exam Vitals signs and nursing note reviewed.  Constitutional:      Appearance: He is well-developed. He is obese. He is not ill-appearing.  HENT:     Head: Normocephalic and atraumatic.  Neck:     Musculoskeletal: Neck supple.  Cardiovascular:     Rate and Rhythm: Normal rate and regular rhythm.     Heart sounds: Normal heart sounds. No murmur.  Pulmonary:     Effort: Pulmonary effort is normal. No respiratory distress.     Breath sounds: Normal breath sounds. No wheezing.  Abdominal:     General: Bowel sounds are normal.     Palpations: Abdomen is soft.     Tenderness: There is no abdominal tenderness. There is no right CVA  tenderness, left CVA tenderness or rebound.  Lymphadenopathy:     Cervical: No cervical adenopathy.  Skin:    General: Skin is warm and dry.  Neurological:     Mental Status: He is alert and oriented to person, place, and time.  Psychiatric:        Mood and Affect: Mood normal.      ED Treatments / Results  Labs (all labs ordered are listed, but only abnormal results are displayed) Labs Reviewed  CBC WITH DIFFERENTIAL/PLATELET  BASIC METABOLIC PANEL  URINALYSIS, ROUTINE W REFLEX MICROSCOPIC    EKG None  Radiology No results found.  Procedures Procedures (including critical care time)  Medications Ordered in ED Medications  sodium chloride 0.9 % bolus 500 mL (500 mLs Intravenous New Bag/Given 04/27/19 [id number])     Initial Impression / Assessment and Plan / ED Course  I have reviewed the triage vital signs and the nursing notes.  Pertinent labs & imaging results that were available during my care of the patient were reviewed by me and considered in my medical decision making (see chart for details).        Patient presents with left flank pain.  Acute in onset.  Waxing and waning in intensity.  He is overall nontoxic and vital signs are reassuring.  No reproducible pain on exam.  Considerations include kidney stone, UTI, less likely colitis.  Patient was given fluids.  Initially he was not given any pain medication as he was pain controlled but pain recurred and he was given a dose of medication.  Will obtain a CT stone study, urinalysis and basic lab work.  Patient signed out to oncoming provider.  Final Clinical Impressions(s) / ED Diagnoses   Final diagnoses:  None    ED Discharge Orders    None       Brogan England, Barbette Hair, MD 04/27/19 [phone]

## 2019-04-27 NOTE — Discharge Instructions (Addendum)
You were evaluated in the Emergency Department and after careful evaluation, we did not find any emergent condition requiring admission or further testing in the hospital.  Your exam/testing today is overall reassuring.  Our CT scan today confirmed the presence of a kidney stone.  You should be able to pass this kidney stone on your own with time.  We recommend Tylenol or ibuprofen at home for discomfort.  For more significant pain, you can use the Norco medication provided.  We recommend follow-up with a urologist for further kidney stone management.  Your CT scan also revealed a small liver nodule or lesion.  These are common and usually not a concern.  However the radiologist are recommending an MRI to take a closer look at the lesion.  Please discuss this with your primary care doctor and have your doctor help you schedule this abdominal or liver MRI as an outpatient.  Please return to the Emergency Department if you experience any worsening of your condition.  We encourage you to follow up with a primary care provider.  Thank you for allowing [place] to be a part of your care.

## 2019-04-27 NOTE — ED Provider Notes (Signed)
  Provider Note MRN:  [id number]  Arrival date & time: 04/27/19    ED Course and Medical Decision Making  Assumed care from Dr. Dina Rich at shift change.  Suspect kidney stone, awaiting imaging and BMP.  8:40 AM update: CT confirms kidney stone, urinalysis without evidence of infection, no AKI.  Patient is appropriate for discharge.  Final Clinical Impressions(s) / ED Diagnoses     ICD-10-CM   1. Kidney stone  N20.0   2. Liver lesion  K76.9     ED Discharge Orders         Ordered    HYDROcodone-acetaminophen (NORCO/VICODIN) 5-325 MG tablet  Every 4 hours PRN     04/27/19 0842            Discharge Instructions     You were evaluated in the Emergency Department and after careful evaluation, we did not find any emergent condition requiring admission or further testing in the hospital.  Your exam/testing today is overall reassuring.  Our CT scan today confirmed the presence of a kidney stone.  You should be able to pass this kidney stone on your own with time.  We recommend Tylenol or ibuprofen at home for discomfort.  For more significant pain, you can use the Norco medication provided.  We recommend follow-up with a urologist for further kidney stone management.  Your CT scan also revealed a small liver nodule or lesion.  These are common and usually not a concern.  However the radiologist are recommending an MRI to take a closer look at the lesion.  Please discuss this with your primary care doctor and have your doctor help you schedule this abdominal or liver MRI as an outpatient.  Please return to the Emergency Department if you experience any worsening of your condition.  We encourage you to follow up with a primary care provider.  Thank you for allowing [place] to be a part of your care.     Barth Kirks. Sedonia Small, [place] [email or web address]    Maudie Flakes, MD 04/27/19 [phone]

## 2019-05-04 DIAGNOSIS — M79642 Pain in left hand: Secondary | ICD-10-CM | POA: Diagnosis not present

## 2019-05-04 DIAGNOSIS — Z7984 Long term (current) use of oral hypoglycemic drugs: Secondary | ICD-10-CM | POA: Diagnosis not present

## 2019-05-04 DIAGNOSIS — E119 Type 2 diabetes mellitus without complications: Secondary | ICD-10-CM | POA: Diagnosis not present

## 2019-05-04 DIAGNOSIS — M353 Polymyalgia rheumatica: Secondary | ICD-10-CM | POA: Diagnosis not present

## 2019-05-04 DIAGNOSIS — E1142 Type 2 diabetes mellitus with diabetic polyneuropathy: Secondary | ICD-10-CM | POA: Diagnosis not present

## 2019-05-09 DIAGNOSIS — M79642 Pain in left hand: Secondary | ICD-10-CM | POA: Diagnosis not present

## 2019-05-09 DIAGNOSIS — M65332 Trigger finger, left middle finger: Secondary | ICD-10-CM | POA: Diagnosis not present

## 2019-06-12 DIAGNOSIS — E7800 Pure hypercholesterolemia, unspecified: Secondary | ICD-10-CM | POA: Diagnosis not present

## 2019-06-12 DIAGNOSIS — I10 Essential (primary) hypertension: Secondary | ICD-10-CM | POA: Diagnosis not present

## 2019-06-12 DIAGNOSIS — I2510 Atherosclerotic heart disease of native coronary artery without angina pectoris: Secondary | ICD-10-CM | POA: Diagnosis not present

## 2019-06-12 DIAGNOSIS — N183 Chronic kidney disease, stage 3 unspecified: Secondary | ICD-10-CM | POA: Diagnosis not present

## 2019-06-12 DIAGNOSIS — E1142 Type 2 diabetes mellitus with diabetic polyneuropathy: Secondary | ICD-10-CM | POA: Diagnosis not present

## 2019-06-14 DIAGNOSIS — M25532 Pain in left wrist: Secondary | ICD-10-CM | POA: Diagnosis not present

## 2019-06-14 DIAGNOSIS — M65332 Trigger finger, left middle finger: Secondary | ICD-10-CM | POA: Diagnosis not present

## 2019-06-14 DIAGNOSIS — M79642 Pain in left hand: Secondary | ICD-10-CM | POA: Diagnosis not present

## 2019-07-10 DIAGNOSIS — E7800 Pure hypercholesterolemia, unspecified: Secondary | ICD-10-CM | POA: Diagnosis not present

## 2019-07-10 DIAGNOSIS — I10 Essential (primary) hypertension: Secondary | ICD-10-CM | POA: Diagnosis not present

## 2019-07-10 DIAGNOSIS — E1142 Type 2 diabetes mellitus with diabetic polyneuropathy: Secondary | ICD-10-CM | POA: Diagnosis not present

## 2019-07-10 DIAGNOSIS — N183 Chronic kidney disease, stage 3 unspecified: Secondary | ICD-10-CM | POA: Diagnosis not present

## 2019-07-10 DIAGNOSIS — I2510 Atherosclerotic heart disease of native coronary artery without angina pectoris: Secondary | ICD-10-CM | POA: Diagnosis not present

## 2019-07-18 DIAGNOSIS — M65332 Trigger finger, left middle finger: Secondary | ICD-10-CM | POA: Diagnosis not present

## 2019-07-24 DIAGNOSIS — M353 Polymyalgia rheumatica: Secondary | ICD-10-CM | POA: Diagnosis not present

## 2019-07-24 DIAGNOSIS — E1142 Type 2 diabetes mellitus with diabetic polyneuropathy: Secondary | ICD-10-CM | POA: Diagnosis not present

## 2019-07-24 DIAGNOSIS — Z794 Long term (current) use of insulin: Secondary | ICD-10-CM | POA: Diagnosis not present

## 2019-07-24 DIAGNOSIS — N1831 Chronic kidney disease, stage 3a: Secondary | ICD-10-CM | POA: Diagnosis not present

## 2019-07-24 DIAGNOSIS — I10 Essential (primary) hypertension: Secondary | ICD-10-CM | POA: Diagnosis not present

## 2019-07-24 DIAGNOSIS — E7800 Pure hypercholesterolemia, unspecified: Secondary | ICD-10-CM | POA: Diagnosis not present

## 2019-07-24 DIAGNOSIS — I2510 Atherosclerotic heart disease of native coronary artery without angina pectoris: Secondary | ICD-10-CM | POA: Diagnosis not present

## 2019-09-04 ENCOUNTER — Ambulatory Visit: Payer: Medicare Other | Admitting: Cardiology

## 2019-09-09 DIAGNOSIS — N183 Chronic kidney disease, stage 3 unspecified: Secondary | ICD-10-CM | POA: Diagnosis not present

## 2019-09-09 DIAGNOSIS — I10 Essential (primary) hypertension: Secondary | ICD-10-CM | POA: Diagnosis not present

## 2019-09-09 DIAGNOSIS — I2510 Atherosclerotic heart disease of native coronary artery without angina pectoris: Secondary | ICD-10-CM | POA: Diagnosis not present

## 2019-09-09 DIAGNOSIS — E1142 Type 2 diabetes mellitus with diabetic polyneuropathy: Secondary | ICD-10-CM | POA: Diagnosis not present

## 2019-09-09 DIAGNOSIS — E7800 Pure hypercholesterolemia, unspecified: Secondary | ICD-10-CM | POA: Diagnosis not present

## 2019-10-02 DIAGNOSIS — N183 Chronic kidney disease, stage 3 unspecified: Secondary | ICD-10-CM | POA: Diagnosis not present

## 2019-10-02 DIAGNOSIS — Z7984 Long term (current) use of oral hypoglycemic drugs: Secondary | ICD-10-CM | POA: Diagnosis not present

## 2019-10-02 DIAGNOSIS — I2510 Atherosclerotic heart disease of native coronary artery without angina pectoris: Secondary | ICD-10-CM | POA: Diagnosis not present

## 2019-10-02 DIAGNOSIS — I10 Essential (primary) hypertension: Secondary | ICD-10-CM | POA: Diagnosis not present

## 2019-10-02 DIAGNOSIS — E1142 Type 2 diabetes mellitus with diabetic polyneuropathy: Secondary | ICD-10-CM | POA: Diagnosis not present

## 2019-10-02 DIAGNOSIS — E7800 Pure hypercholesterolemia, unspecified: Secondary | ICD-10-CM | POA: Diagnosis not present

## 2020-02-07 DIAGNOSIS — E7800 Pure hypercholesterolemia, unspecified: Secondary | ICD-10-CM | POA: Diagnosis not present

## 2020-02-07 DIAGNOSIS — I2510 Atherosclerotic heart disease of native coronary artery without angina pectoris: Secondary | ICD-10-CM | POA: Diagnosis not present

## 2020-02-07 DIAGNOSIS — I10 Essential (primary) hypertension: Secondary | ICD-10-CM | POA: Diagnosis not present

## 2020-02-07 DIAGNOSIS — Z794 Long term (current) use of insulin: Secondary | ICD-10-CM | POA: Diagnosis not present

## 2020-02-07 DIAGNOSIS — M353 Polymyalgia rheumatica: Secondary | ICD-10-CM | POA: Diagnosis not present

## 2020-02-07 DIAGNOSIS — E1142 Type 2 diabetes mellitus with diabetic polyneuropathy: Secondary | ICD-10-CM | POA: Diagnosis not present

## 2020-08-01 DIAGNOSIS — R946 Abnormal results of thyroid function studies: Secondary | ICD-10-CM | POA: Diagnosis not present

## 2020-08-01 DIAGNOSIS — I10 Essential (primary) hypertension: Secondary | ICD-10-CM | POA: Diagnosis not present

## 2020-08-01 DIAGNOSIS — F5101 Primary insomnia: Secondary | ICD-10-CM | POA: Diagnosis not present

## 2020-08-01 DIAGNOSIS — Z794 Long term (current) use of insulin: Secondary | ICD-10-CM | POA: Diagnosis not present

## 2020-08-01 DIAGNOSIS — E7800 Pure hypercholesterolemia, unspecified: Secondary | ICD-10-CM | POA: Diagnosis not present

## 2020-08-01 DIAGNOSIS — I2510 Atherosclerotic heart disease of native coronary artery without angina pectoris: Secondary | ICD-10-CM | POA: Diagnosis not present

## 2020-08-01 DIAGNOSIS — E1142 Type 2 diabetes mellitus with diabetic polyneuropathy: Secondary | ICD-10-CM | POA: Diagnosis not present

## 2020-08-01 DIAGNOSIS — N1831 Chronic kidney disease, stage 3a: Secondary | ICD-10-CM | POA: Diagnosis not present

## 2020-08-01 DIAGNOSIS — M353 Polymyalgia rheumatica: Secondary | ICD-10-CM | POA: Diagnosis not present

## 2020-10-17 DIAGNOSIS — U071 COVID-19: Secondary | ICD-10-CM | POA: Diagnosis not present

## 2020-10-17 DIAGNOSIS — R059 Cough, unspecified: Secondary | ICD-10-CM | POA: Diagnosis not present

## 2021-01-26 IMAGING — CT CT RENAL STONE PROTOCOL
2 of 4 series · 15 of 46 positions shown, 17 images · non-contrast
Comparison: Chest CT 10/19/2018.

CLINICAL DATA: 78-year-old male with left flank pain today.

EXAM:
CT ABDOMEN AND PELVIS WITHOUT CONTRAST
TECHNIQUE: Multidetector CT imaging of the abdomen and pelvis was performed
following the standard protocol without IV contrast.

[Series 2: axial st · axial · 0.98mm/px · z∈[-530,-60]mm · 12 of 104 slices shown, 14 images]
[im 5/104  soft-tissue]
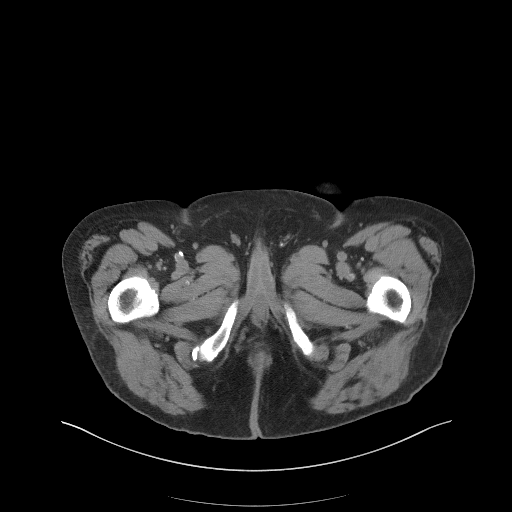
[im 5/104  bone]
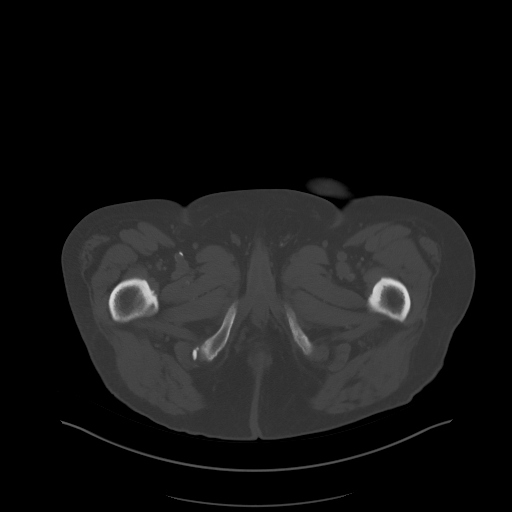
[im 14/104  soft-tissue]
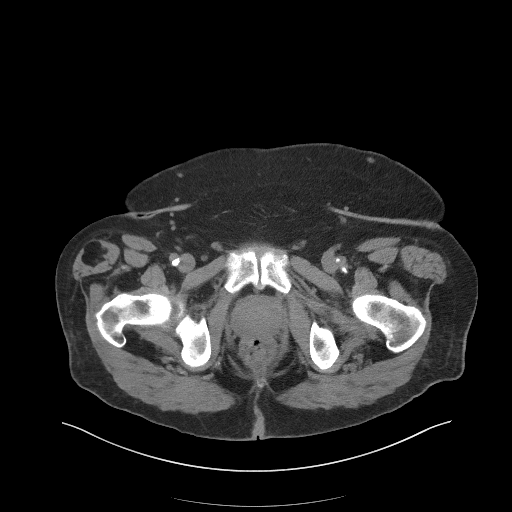
[im 23/104  soft-tissue]
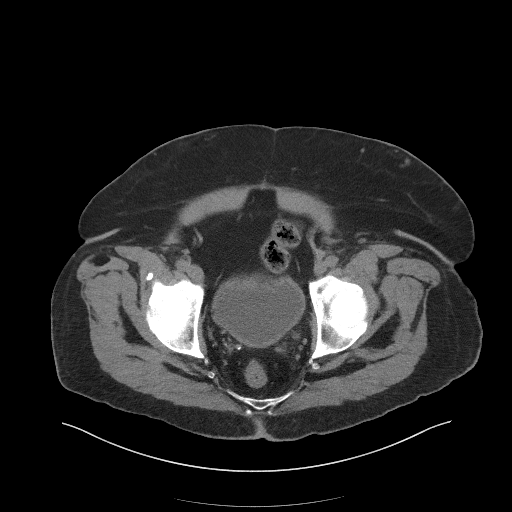
[im 32/104  soft-tissue]
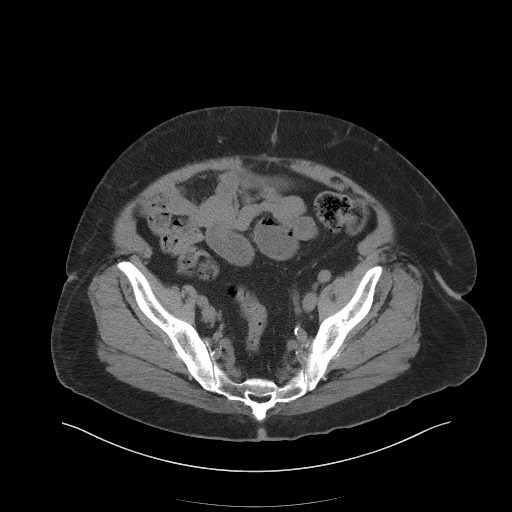
[im 41/104  soft-tissue]
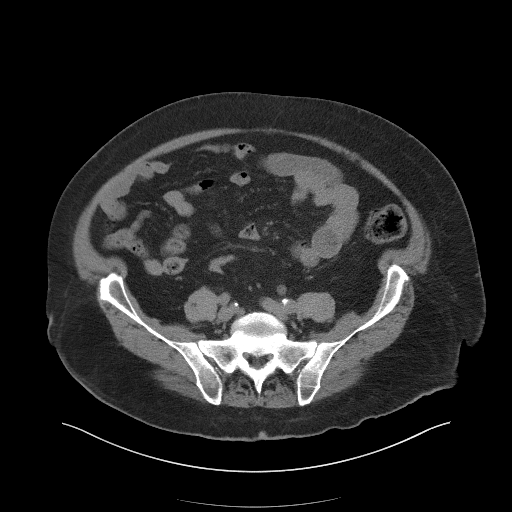
[im 50/104  soft-tissue]
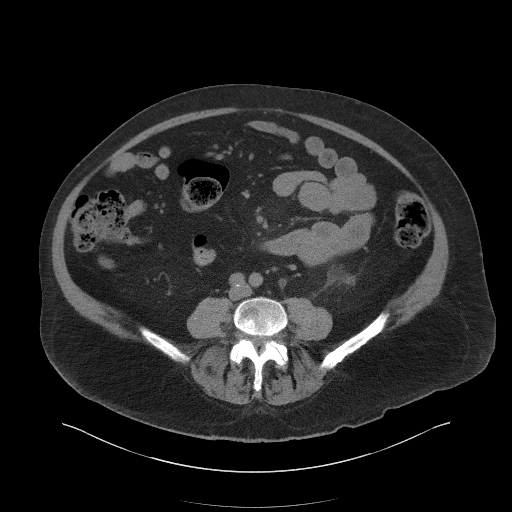
[im 54/104  soft-tissue]
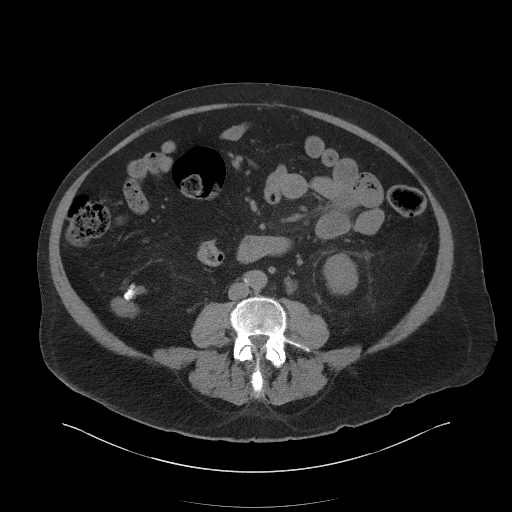
[im 63/104  soft-tissue]
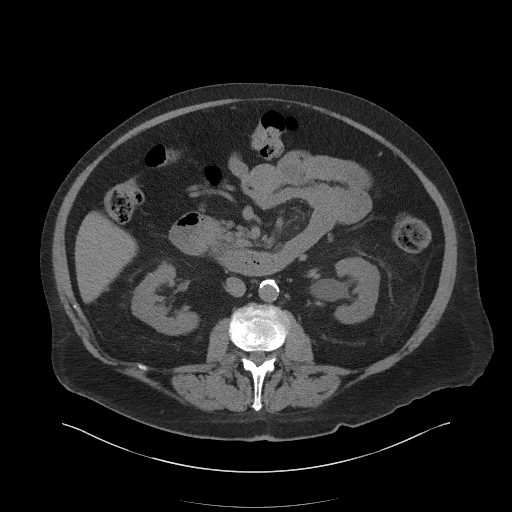
[im 72/104  soft-tissue]
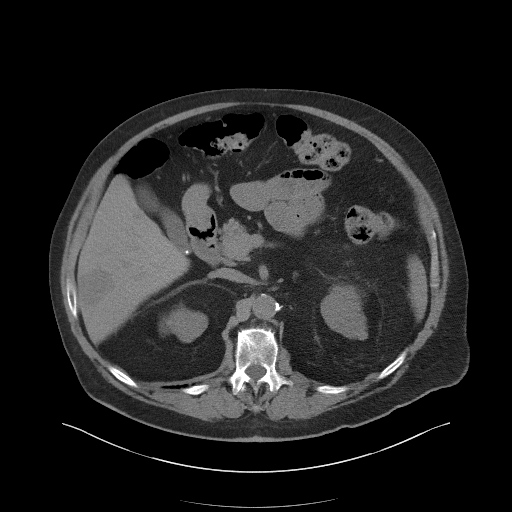
[im 72/104  bone]
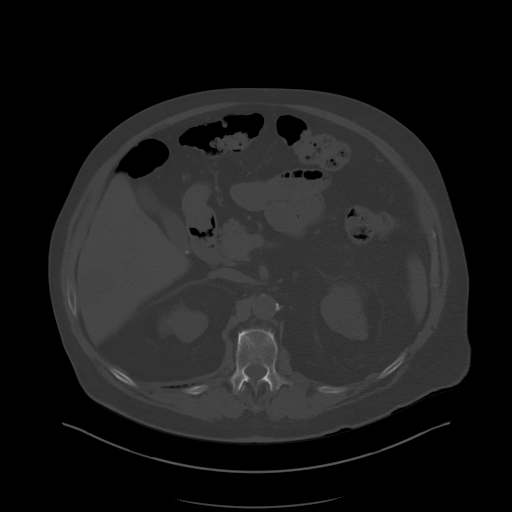
[im 81/104  soft-tissue]
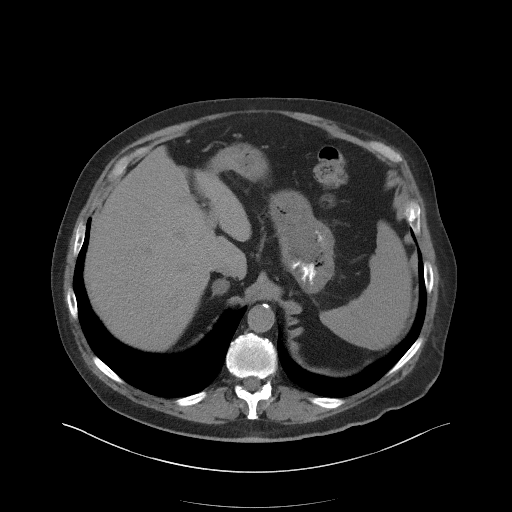
[im 90/104  soft-tissue]
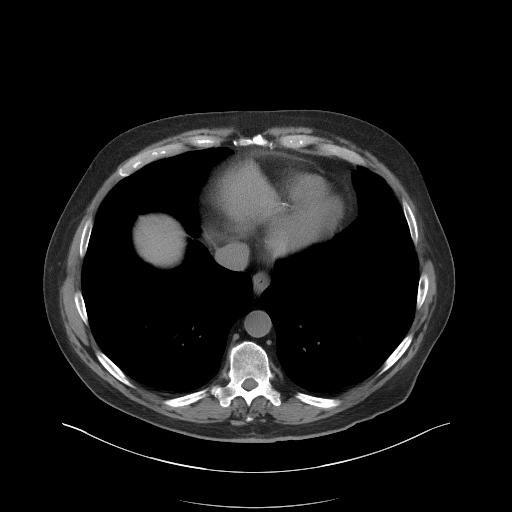
[im 99/104  soft-tissue]
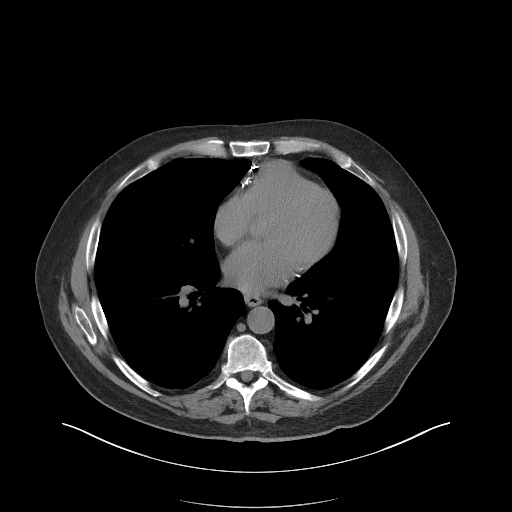

[Series 4: coronal st · coronal · 1.04mm/px · 3 of 108 slices shown]
[im 36/108  soft-tissue]
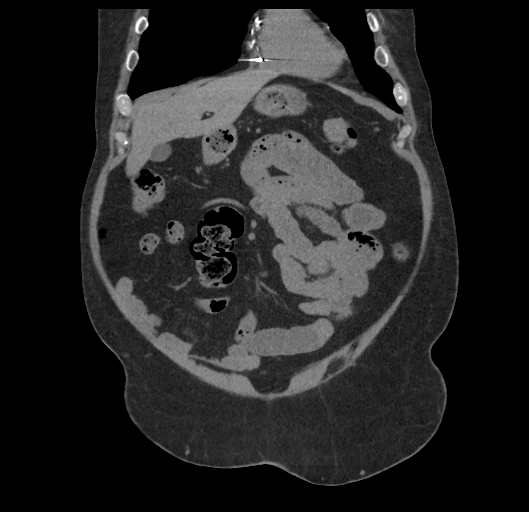
[im 48/108  soft-tissue]
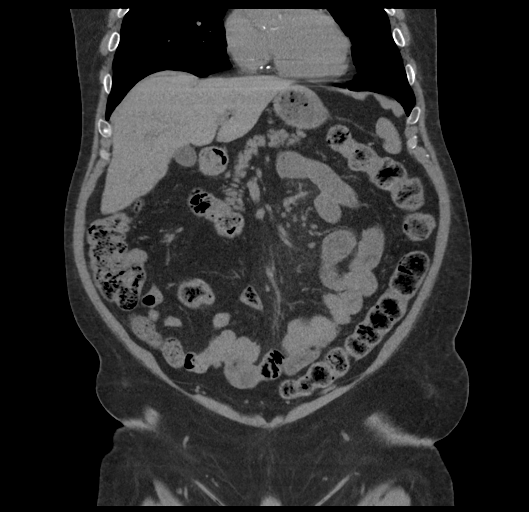
[im 60/108  soft-tissue]
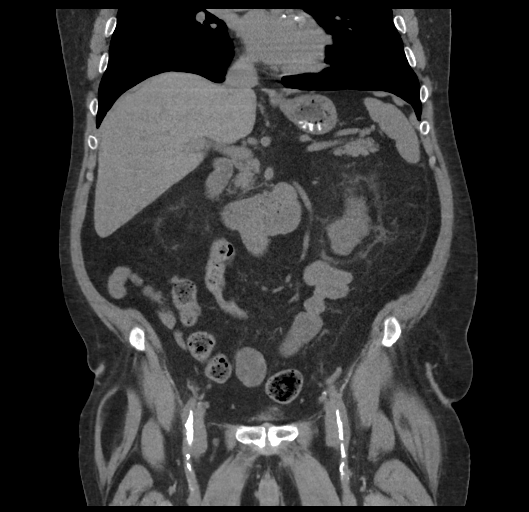

[15 of 46 positions shown; findings below may reference images not displayed]

FINDINGS: Lower chest: A small round posterior right pleural mass on series 3,
image 11 is stable and was previously thought to be a benign pleural
lipoma. Otherwise negative lung bases. Prior sternotomy and CABG. No
cardiomegaly, pericardial or pleural effusion. Small fat containing
subxiphoid hernia on series 2, image 21 is stable.

Hepatobiliary: A 3.7 centimeter round hypodense lesion in the
inferior right hepatic lobe on series 2, image 31 was not apparent
on the noncontrast chest CT in [REDACTED]. No other liver lesion is
identified in the absence of IV contrast. Punctate gallstone on
series 2, image 33 but otherwise negative gallbladder.

Pancreas: Negative.

Spleen: Negative.

Adrenals/Urinary Tract: Normal left adrenal gland. A nearly 20
millimeter round right adrenal nodule is stable since [REDACTED] but has
intermediate density and is indeterminate as before (series 2, image
26).

There is a lobulated exophytic lesion arising from the right lower
pole with adjacent dystrophic calcification. See coronal images 72
and 75. However, this has simple fluid density. The right lower pole
is not included in [REDACTED]. Otherwise negative noncontrast right
kidney and right ureter.

Left hydronephrosis and mild to moderate perinephric stranding.
Punctate left renal upper pole calculus. Left hydroureter and
periureteral stranding continuing to the pelvis. Just inside the
urinary bladder there is a 5 millimeter calculus (series 2, image
84). Unremarkable bladder otherwise. Small superimposed
proteinaceous cysts of the left lower pole are suspected.

Stomach/Bowel: Redundant sigmoid colon tracks into the right mid
abdomen with retained stool, but otherwise is negative. Negative
rectum. Similar retained stool in the descending colon. Negative
transverse colon. Negative right colon. Normal appendix on coronal
image 54. Negative terminal ileum. No dilated small bowel.
Decompressed stomach. No free air or free fluid.

Vascular/Lymphatic: Vascular patency is not evaluated in the absence
of IV contrast. Aortoiliac calcified atherosclerosis. No
lymphadenopathy.

Reproductive: Negative.

Other: No pelvic free fluid.

Musculoskeletal: Prior sternotomy. Benign-appearing bone island of
the left L1 pedicle. Intermittent lumbar spine disc and posterior
element degeneration. No acute osseous abnormality identified.
IMPRESSION: 1. Indeterminate 3.7 cm hypodense lesion in the right lobe of the
liver was not apparent on the non-contrast Chest CT in [REDACTED] and
meets consensus criteria for follow-up Abdominal MRI (liver
protocol, without and with contrast) to further characterize.
A stable but indeterminate small right adrenal nodule, as well as
small bilateral renal cystic lesions might also be definitively
characterized with that exam.
This recommendation follows ACR consensus guidelines: Management of
Incidental Liver Lesions on CT: A White Paper of the ACR Incidental
Findings Committee. [HOSPITAL] 4326; 14:[id number].
2. Positive for acute obstructive uropathy on the left with a 5 mm
calculus just inside the bladder near the left UVJ.
3. Punctate additional left nephrolithiasis.
4. Punctate cholelithiasis.
5. Aortic Atherosclerosis ([id number]).

## 2021-02-11 DIAGNOSIS — E1142 Type 2 diabetes mellitus with diabetic polyneuropathy: Secondary | ICD-10-CM | POA: Diagnosis not present

## 2021-02-11 DIAGNOSIS — I10 Essential (primary) hypertension: Secondary | ICD-10-CM | POA: Diagnosis not present

## 2021-02-11 DIAGNOSIS — E7800 Pure hypercholesterolemia, unspecified: Secondary | ICD-10-CM | POA: Diagnosis not present

## 2021-02-11 DIAGNOSIS — I2510 Atherosclerotic heart disease of native coronary artery without angina pectoris: Secondary | ICD-10-CM | POA: Diagnosis not present

## 2021-02-11 DIAGNOSIS — M353 Polymyalgia rheumatica: Secondary | ICD-10-CM | POA: Diagnosis not present

## 2021-04-28 DIAGNOSIS — M9902 Segmental and somatic dysfunction of thoracic region: Secondary | ICD-10-CM | POA: Diagnosis not present

## 2021-04-28 DIAGNOSIS — M9905 Segmental and somatic dysfunction of pelvic region: Secondary | ICD-10-CM | POA: Diagnosis not present

## 2021-04-28 DIAGNOSIS — M5134 Other intervertebral disc degeneration, thoracic region: Secondary | ICD-10-CM | POA: Diagnosis not present

## 2021-04-28 DIAGNOSIS — M9903 Segmental and somatic dysfunction of lumbar region: Secondary | ICD-10-CM | POA: Diagnosis not present

## 2021-04-28 DIAGNOSIS — M5136 Other intervertebral disc degeneration, lumbar region: Secondary | ICD-10-CM | POA: Diagnosis not present

## 2021-04-30 DIAGNOSIS — M9903 Segmental and somatic dysfunction of lumbar region: Secondary | ICD-10-CM | POA: Diagnosis not present

## 2021-04-30 DIAGNOSIS — M9902 Segmental and somatic dysfunction of thoracic region: Secondary | ICD-10-CM | POA: Diagnosis not present

## 2021-04-30 DIAGNOSIS — M5136 Other intervertebral disc degeneration, lumbar region: Secondary | ICD-10-CM | POA: Diagnosis not present

## 2021-04-30 DIAGNOSIS — M9905 Segmental and somatic dysfunction of pelvic region: Secondary | ICD-10-CM | POA: Diagnosis not present

## 2021-04-30 DIAGNOSIS — M5134 Other intervertebral disc degeneration, thoracic region: Secondary | ICD-10-CM | POA: Diagnosis not present

## 2021-05-05 DIAGNOSIS — M9905 Segmental and somatic dysfunction of pelvic region: Secondary | ICD-10-CM | POA: Diagnosis not present

## 2021-05-05 DIAGNOSIS — M5136 Other intervertebral disc degeneration, lumbar region: Secondary | ICD-10-CM | POA: Diagnosis not present

## 2021-05-05 DIAGNOSIS — M9903 Segmental and somatic dysfunction of lumbar region: Secondary | ICD-10-CM | POA: Diagnosis not present

## 2021-05-05 DIAGNOSIS — M9902 Segmental and somatic dysfunction of thoracic region: Secondary | ICD-10-CM | POA: Diagnosis not present

## 2021-05-05 DIAGNOSIS — M5134 Other intervertebral disc degeneration, thoracic region: Secondary | ICD-10-CM | POA: Diagnosis not present

## 2021-05-07 DIAGNOSIS — M5136 Other intervertebral disc degeneration, lumbar region: Secondary | ICD-10-CM | POA: Diagnosis not present

## 2021-05-07 DIAGNOSIS — M5134 Other intervertebral disc degeneration, thoracic region: Secondary | ICD-10-CM | POA: Diagnosis not present

## 2021-05-07 DIAGNOSIS — M9905 Segmental and somatic dysfunction of pelvic region: Secondary | ICD-10-CM | POA: Diagnosis not present

## 2021-05-07 DIAGNOSIS — M9903 Segmental and somatic dysfunction of lumbar region: Secondary | ICD-10-CM | POA: Diagnosis not present

## 2021-05-07 DIAGNOSIS — M9902 Segmental and somatic dysfunction of thoracic region: Secondary | ICD-10-CM | POA: Diagnosis not present

## 2021-05-12 DIAGNOSIS — M5136 Other intervertebral disc degeneration, lumbar region: Secondary | ICD-10-CM | POA: Diagnosis not present

## 2021-05-12 DIAGNOSIS — M9902 Segmental and somatic dysfunction of thoracic region: Secondary | ICD-10-CM | POA: Diagnosis not present

## 2021-05-12 DIAGNOSIS — M9903 Segmental and somatic dysfunction of lumbar region: Secondary | ICD-10-CM | POA: Diagnosis not present

## 2021-05-12 DIAGNOSIS — M9905 Segmental and somatic dysfunction of pelvic region: Secondary | ICD-10-CM | POA: Diagnosis not present

## 2021-05-12 DIAGNOSIS — M5134 Other intervertebral disc degeneration, thoracic region: Secondary | ICD-10-CM | POA: Diagnosis not present

## 2021-08-28 DIAGNOSIS — E1142 Type 2 diabetes mellitus with diabetic polyneuropathy: Secondary | ICD-10-CM | POA: Diagnosis not present

## 2021-08-28 DIAGNOSIS — I10 Essential (primary) hypertension: Secondary | ICD-10-CM | POA: Diagnosis not present

## 2021-08-28 DIAGNOSIS — N1831 Chronic kidney disease, stage 3a: Secondary | ICD-10-CM | POA: Diagnosis not present

## 2021-08-28 DIAGNOSIS — E7800 Pure hypercholesterolemia, unspecified: Secondary | ICD-10-CM | POA: Diagnosis not present

## 2021-08-28 DIAGNOSIS — M353 Polymyalgia rheumatica: Secondary | ICD-10-CM | POA: Diagnosis not present

## 2021-08-28 DIAGNOSIS — I2510 Atherosclerotic heart disease of native coronary artery without angina pectoris: Secondary | ICD-10-CM | POA: Diagnosis not present

## 2021-10-04 DIAGNOSIS — Z20822 Contact with and (suspected) exposure to covid-19: Secondary | ICD-10-CM | POA: Diagnosis not present

## 2022-02-27 DIAGNOSIS — M353 Polymyalgia rheumatica: Secondary | ICD-10-CM | POA: Diagnosis not present

## 2022-02-27 DIAGNOSIS — E1142 Type 2 diabetes mellitus with diabetic polyneuropathy: Secondary | ICD-10-CM | POA: Diagnosis not present

## 2022-02-27 DIAGNOSIS — I10 Essential (primary) hypertension: Secondary | ICD-10-CM | POA: Diagnosis not present

## 2022-02-27 DIAGNOSIS — I2510 Atherosclerotic heart disease of native coronary artery without angina pectoris: Secondary | ICD-10-CM | POA: Diagnosis not present

## 2022-02-27 DIAGNOSIS — E7800 Pure hypercholesterolemia, unspecified: Secondary | ICD-10-CM | POA: Diagnosis not present

## 2022-02-27 DIAGNOSIS — N1831 Chronic kidney disease, stage 3a: Secondary | ICD-10-CM | POA: Diagnosis not present

## 2022-10-09 ENCOUNTER — Emergency Department (HOSPITAL_BASED_OUTPATIENT_CLINIC_OR_DEPARTMENT_OTHER)
Admission: EM | Admit: 2022-10-09 | Discharge: 2022-10-09 | Disposition: A | Discharge status: Home / Self Care | Payer: No Typology Code available for payment source | Attending: Emergency Medicine | Admitting: Emergency Medicine

## 2022-10-09 ENCOUNTER — Emergency Department (HOSPITAL_BASED_OUTPATIENT_CLINIC_OR_DEPARTMENT_OTHER): Payer: No Typology Code available for payment source

## 2022-10-09 ENCOUNTER — Other Ambulatory Visit: Payer: Self-pay

## 2022-10-09 ENCOUNTER — Encounter (HOSPITAL_BASED_OUTPATIENT_CLINIC_OR_DEPARTMENT_OTHER): Payer: Self-pay | Admitting: Emergency Medicine

## 2022-10-09 DIAGNOSIS — K8020 Calculus of gallbladder without cholecystitis without obstruction: Secondary | ICD-10-CM | POA: Diagnosis not present

## 2022-10-09 DIAGNOSIS — N281 Cyst of kidney, acquired: Secondary | ICD-10-CM | POA: Diagnosis not present

## 2022-10-09 DIAGNOSIS — Z951 Presence of aortocoronary bypass graft: Secondary | ICD-10-CM | POA: Insufficient documentation

## 2022-10-09 DIAGNOSIS — R112 Nausea with vomiting, unspecified: Secondary | ICD-10-CM

## 2022-10-09 DIAGNOSIS — Z7984 Long term (current) use of oral hypoglycemic drugs: Secondary | ICD-10-CM | POA: Diagnosis not present

## 2022-10-09 DIAGNOSIS — Z7982 Long term (current) use of aspirin: Secondary | ICD-10-CM | POA: Insufficient documentation

## 2022-10-09 DIAGNOSIS — R1013 Epigastric pain: Secondary | ICD-10-CM | POA: Diagnosis present

## 2022-10-09 DIAGNOSIS — E119 Type 2 diabetes mellitus without complications: Secondary | ICD-10-CM | POA: Insufficient documentation

## 2022-10-09 DIAGNOSIS — Z794 Long term (current) use of insulin: Secondary | ICD-10-CM | POA: Diagnosis not present

## 2022-10-09 DIAGNOSIS — N2889 Other specified disorders of kidney and ureter: Secondary | ICD-10-CM

## 2022-10-09 LAB — URINALYSIS, ROUTINE W REFLEX MICROSCOPIC
Bilirubin Urine: NEGATIVE
Glucose, UA: >=500 mg/dL — AB
Hgb urine dipstick: NEGATIVE
Ketones, ur: NEGATIVE mg/dL
Leukocytes,Ua: NEGATIVE
Nitrite: NEGATIVE
Protein, ur: NEGATIVE mg/dL
Specific Gravity, Urine: <=1.005 (ref 1.005–1.030)
pH: 5.5 (ref 5.0–8.0)

## 2022-10-09 LAB — CBC
HCT: 45.8 % (ref 39.0–52.0)
Hemoglobin: 15.3 g/dL (ref 13.0–17.0)
MCH: 31.2 pg (ref 26.0–34.0)
MCHC: 33.4 g/dL (ref 30.0–36.0)
MCV: 93.3 fL (ref 80.0–100.0)
Platelets: 275 10*3/uL (ref 150–400)
RBC: 4.91 MIL/uL (ref 4.22–5.81)
RDW: 13.9 % (ref 11.5–15.5)
WBC: 12.7 10*3/uL — ABNORMAL HIGH (ref 4.0–10.5)
nRBC: 0 % (ref 0.0–0.2)

## 2022-10-09 LAB — COMPREHENSIVE METABOLIC PANEL
ALT: 21 U/L (ref 0–44)
AST: 25 U/L (ref 15–41)
Albumin: 3.8 g/dL (ref 3.5–5.0)
Alkaline Phosphatase: 41 U/L (ref 38–126)
Anion gap: 9 (ref 5–15)
BUN: 30 mg/dL — ABNORMAL HIGH (ref 8–23)
CO2: 22 mmol/L (ref 22–32)
Calcium: 9.2 mg/dL (ref 8.9–10.3)
Chloride: 102 mmol/L (ref 98–111)
Creatinine, Ser: 1.17 mg/dL (ref 0.61–1.24)
GFR, Estimated: >60 mL/min (ref >60)
Glucose, Bld: 182 mg/dL — ABNORMAL HIGH (ref 70–99)
Potassium: 4.3 mmol/L (ref 3.5–5.1)
Sodium: 133 mmol/L — ABNORMAL LOW (ref 135–145)
Total Bilirubin: 0.9 mg/dL (ref 0.3–1.2)
Total Protein: 6.8 g/dL (ref 6.5–8.1)

## 2022-10-09 LAB — TROPONIN I (HIGH SENSITIVITY)
Troponin I (High Sensitivity): 5 ng/L (ref <18)
Troponin I (High Sensitivity): 5 ng/L (ref <18)

## 2022-10-09 LAB — URINALYSIS, MICROSCOPIC (REFLEX)

## 2022-10-09 LAB — LIPASE, BLOOD: Lipase: 55 U/L — ABNORMAL HIGH (ref 11–51)

## 2022-10-09 MED ORDER — IOHEXOL 300 MG/ML  SOLN
100.0000 mL | Freq: Once | INTRAMUSCULAR | Status: AC | PRN
  Administered 2022-10-09: 100 mL via INTRAVENOUS

## 2022-10-09 MED ORDER — ONDANSETRON 4 MG PO TBDP: 4.0000 mg | ORAL_TABLET | Freq: Three times a day (TID) | ORAL | 0 refills | Status: AC | PRN

## 2022-10-09 MED ORDER — ONDANSETRON HCL 4 MG/2ML IJ SOLN
4.0000 mg | Freq: Once | INTRAMUSCULAR | Status: AC
  Administered 2022-10-09: 4 mg via INTRAVENOUS
  Filled 2022-10-09: qty 2

## 2022-10-09 NOTE — ED Notes (Signed)
Pt off the floor in CT 

## 2022-10-09 NOTE — ED Notes (Signed)
Pt given diet ginger ale for PO challenge 

## 2022-10-09 NOTE — ED Provider Notes (Signed)
[facility] Provider Note   CSN: [id number] Arrival date & time: 10/09/22  1445     History  Chief Complaint  Patient presents with   Abdominal Pain    Ryan Bridges is a 82 y.o. male w/ hx of cardiac bypass about 10 years ago at the [facility] hospital, diabetes on insulin, HLD, presenting to the ED with abdominal discomfort.  Patient reports that he noticed this morning after he was already awake that he was having discomfort in his abdomen.  He had a loss of appetite and did not want to eat breakfast.  He describes a gnawing feeling near his epigastrium.  The pain is since gone away.  He also describes feeling fatigued all day.  He denies lightheadedness, diaphoresis, shortness of breath, cough.  He had a regular bowel movement today and denies diarrhea, bloody stools or constipation.  He denies history of abdominal surgery.  He reports he takes a baby aspirin for his heart, and denies history of angina or discomfort with exertion.  He does try to exercise a little bit at the gym on a regular basis.  HPI     Home Medications Prior to Admission medications   Medication Sig Start Date End Date Taking? Authorizing Provider  ondansetron (ZOFRAN-ODT) 4 MG disintegrating tablet Take 1 tablet (4 mg total) by mouth every 8 (eight) hours as needed for up to 12 doses for nausea or vomiting. 10/09/22  Yes Stepheny Canal, Carola Rhine, MD  aspirin EC 81 MG tablet Take 81 mg by mouth daily.    [provider]  celecoxib (CELEBREX) 200 MG capsule Take 1 capsule (200 mg total) by mouth 2 (two) times daily. 03/14/18   Isla Pence, MD  Empagliflozin (JARDIANCE PO) Take by mouth.    [provider]  HYDROcodone-acetaminophen (NORCO/VICODIN) 5-325 MG tablet Take 1 tablet by mouth every 4 (four) hours as needed. 04/27/19   Maudie Flakes, MD  insulin detemir (LEVEMIR) 100 UNIT/ML injection Inject into the skin daily.    [provider]  metFORMIN  (GLUCOPHAGE) 1000 MG tablet Take 1,000 mg by mouth 2 (two) times daily with a meal.    [provider]  metoprolol succinate (TOPROL-XL) 12.5 mg TB24 24 hr tablet Take 12.5 mg by mouth daily.    [provider]  predniSONE (DELTASONE) 5 MG tablet  04/09/19   [provider]  Rosuvastatin Calcium (CRESTOR PO) Take by mouth.    [provider]  Semaglutide (OZEMPIC [place]) Inject into the skin.    [provider]  Semaglutide,0.25 or 0.'5MG'$ /DOS, (OZEMPIC, 0.25 OR 0.5 MG/DOSE,) 2 MG/1.5ML SOPN Ozempic  weekly    [provider]  sitaGLIPtin (JANUVIA) 50 MG tablet Take by mouth. 06/30/13   [provider]      Allergies    Benadryl [diphenhydramine]    Review of Systems   Review of Systems  Physical Exam Updated Vital Signs BP 110/68   Pulse 99   Temp 98.7 F (37.1 C)   Resp 12   Wt 94.3 kg   SpO2 96%   BMI 29.01 kg/m  Physical Exam Constitutional:      General: He is not in acute distress. HENT:     Head: Normocephalic and atraumatic.  Eyes:     Conjunctiva/sclera: Conjunctivae normal.     Pupils: Pupils are equal, round, and reactive to light.  Cardiovascular:     Rate and Rhythm: Normal rate and regular rhythm.  Pulmonary:  Effort: Pulmonary effort is normal. No respiratory distress.  Abdominal:     General: There is no distension.     Tenderness: There is no abdominal tenderness. There is no guarding or rebound. Negative signs include Murphy's sign.  Skin:    General: Skin is warm and dry.  Neurological:     General: No focal deficit present.     Mental Status: He is alert. Mental status is at baseline.  Psychiatric:        Mood and Affect: Mood normal.        Behavior: Behavior normal.     ED Results / Procedures / Treatments   Labs (all labs ordered are listed, but only abnormal results are displayed) Labs Reviewed  LIPASE, BLOOD - Abnormal; Notable for the following components:      Result Value    Lipase 55 (*)    All other components within normal limits  COMPREHENSIVE METABOLIC PANEL - Abnormal; Notable for the following components:   Sodium 133 (*)    Glucose, Bld 182 (*)    BUN 30 (*)    All other components within normal limits  CBC - Abnormal; Notable for the following components:   WBC 12.7 (*)    All other components within normal limits  URINALYSIS, ROUTINE W REFLEX MICROSCOPIC - Abnormal; Notable for the following components:   Glucose, UA >=500 (*)    All other components within normal limits  URINALYSIS, MICROSCOPIC (REFLEX) - Abnormal; Notable for the following components:   Bacteria, UA RARE (*)    All other components within normal limits  TROPONIN I (HIGH SENSITIVITY)  TROPONIN I (HIGH SENSITIVITY)    EKG EKG Interpretation  Date/Time:  Friday October 09 2022 15:10:03 EST Ventricular Rate:  90 PR Interval:  194 QRS Duration: 141 QT Interval:  412 QTC Calculation: 505 R Axis:   104 Text Interpretation: Sinus rhythm Nonspecific intraventricular conduction delay LBBB does not meet Sgarbossa criteria, no prior ecg for comparison Confirmed by Octaviano Glow [phone]) on 10/09/2022 3:16:59 PM  Radiology US Abdomen Limited RUQ (LIVER/GB)  Result Date: 10/09/2022 CLINICAL DATA:  Abdominal pain.  Gallstones on recent CT. EXAM: ULTRASOUND ABDOMEN LIMITED RIGHT UPPER QUADRANT COMPARISON:  CT on 10/09/2022 FINDINGS: Gallbladder: A 9 mm calculus is seen in the region of the gallbladder neck. No evidence of gallbladder wall thickening or pericholecystic fluid. No sonographic Murphy sign noted by sonographer. Common bile duct: Diameter: 5 mm, within normal limits. Liver: Several homogeneous hyperechoic masses are seen in the liver, largest measuring 4.1 x 3.6 cm. These are consistent with benign hemangiomas. Portal vein is patent on color Doppler imaging with normal direction of blood flow towards the liver. Other: None. IMPRESSION: Cholelithiasis. No sonographic signs of  cholecystitis or biliary ductal dilatation. Several homogeneous hyperechoic liver masses, largest measuring 4 cm, consistent with benign hemangiomas. Electronically Signed   By: Marlaine Hind M.D.   On: 10/09/2022 17:51   CT ABDOMEN PELVIS W CONTRAST  Result Date: 10/09/2022 CLINICAL DATA:  Abdominal pain. EXAM: CT ABDOMEN AND PELVIS WITH CONTRAST TECHNIQUE: Multidetector CT imaging of the abdomen and pelvis was performed using the standard protocol following bolus administration of intravenous contrast. RADIATION DOSE REDUCTION: This exam was performed according to the departmental dose-optimization program which includes automated exposure control, adjustment of the mA and/or kV according to patient size and/or use of iterative reconstruction technique. CONTRAST:  180m OMNIPAQUE IOHEXOL 300 MG/ML  SOLN COMPARISON:  Renal stone CT 04/27/2019 FINDINGS: Lower chest: Small hiatal  hernia. There is some linear opacity at the bases likely scar or atelectasis. No pleural effusion. Status post median sternotomy with coronary artery calcifications. Stable cystic area extrapleural posteriorly along the right hemithorax on series 2, image 1 measuring 3.5 by 2.0 cm. Hepatobiliary: Patent portal vein. Gallbladder has a dependent stone. There are 2 low-attenuation lesion seen in the inferior right hepatic lobe. Focus lateral segment 6 has a dimension of 4.0 by 3.6 cm and Hounsfield units of 35 on portal venous phase. On delayed 50. On the prior study this was seen on the noncontrast exam measuring 3.7 cm. Hounsfield unit at that time 31. No clear abnormal enhancement. There is smaller focus seen in segment 5 adjacent to the gallbladder fossa which is also again noted on the prior measuring a proximally 1 cm. Pancreas: Unremarkable. No pancreatic ductal dilatation or surrounding inflammatory changes. Spleen: Normal in size without focal abnormality. Adrenals/Urinary Tract: Left adrenal gland is preserved. Once again there is  a right adrenal nodule. Previously this measured 21 x 17 mm. Today 22 by 17 mm on series 2, image 16. Hounsfield unit on today's study portal venous phase of 50 and standard renal delay 39. Prior study noncontrast Hounsfield of 20 not clearly an adenoma but lesion is stable for 3-1/2 years. Both kidneys show nonobstructing stones, left-greater-than-right. No collecting system dilatation. Preserved contours of the urinary bladder. There is some left-sided Bosniak 1 renal cysts. Largest measures 2.9 x 2.8 cm. Hounsfield unit of close to 0. Smaller posterior focus in the left kidney is also similar. Other smaller foci. Bosniak 1 lesions. However there is a small focus exophytic from the upper pole left kidney posterolateral on series 2, image 26 which is only measured at a diameter of 8 mm maximally this lesion may be enhancing. There is similar dense focus along the lower pole left kidney exophytic as well measuring 16 mm on series 5, image 52. This could be enhancing. The right kidney has some tiny subcentimeter cystic foci. Is also an exophytic lesion from the lower pole of the right kidney extending lateral. Diameter of the lesion on series 27, image 7 measures 5.2 by 3.9 cm. Hounsfield unit on delay of 37 and portal venous phase 43. This is worrisome for another enhancing lesion. There is some marginal calcification. Stomach/Bowel: Large bowel demonstrates scattered mild colonic stool. There is a redundant course of the sigmoid colon extending into the right midabdomen. The large bowel is nondilated. Normal appendix extends medial to the cecum in the right lower quadrant. Stomach and small bowel are nondilated. Vascular/Lymphatic: Diffuse vascular calcifications are identified. Normal caliber aorta and IVC. 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 No developing abnormal lymph node enlargement identified in the abdomen and pelvis. Reproductive: Prominent prostate. Other: No ascites.  Musculoskeletal: Stable lucent lesion involving the L4 vertebral body. Scattered degenerative changes of the spine and pelvis. IMPRESSION: No bowel obstruction, free air or free fluid. Normal appendix. Scattered stool. Stable low-density liver lesions compared to September 2020. These are not clearly cysts. There are bilateral benign-appearing renal cysts but there also soft tissue lesions which may be enhancing and worrisome for potential neoplasm. Recommend further workup. A specific noncontrast study may be useful or preferably a dynamic MRI due to the atypical nature of these lesions. Bilateral nonobstructing renal stones. Electronically Signed   By: Jill Side M.D.   On: 10/09/2022 16:50    Procedures Procedures    Medications Ordered in ED Medications  ondansetron (ZOFRAN)  injection 4 mg (4 mg Intravenous Given 10/09/22 1623)  iohexol (OMNIPAQUE) 300 MG/ML solution 100 mL (100 mLs Intravenous Contrast Given 10/09/22 1627)    ED Course/ Medical Decision Making/ A&P Clinical Course as of 10/09/22 2349  Fri Oct 09, 2022  1601 Pt reassessed, reports his epigastric discomfort is reproduced with belching in the room.  Unclear significance but CT abdomen ordered for eval for biliary disease vs ileus/SBO v [MT]  1918 Patient is feeling significantly better for his medication including the Zofran was able to tolerate fluids.  He is wanting to go home.  I think this is reasonable at this time.  We discussed incidental findings on CT.  He will follow-up on Monday with his [facility]CP [facility] the [facility].  Provided him written reports of his test to take with him. [MT]    Clinical Course User Index [MT] Amadou Katzenstein, Carola Rhine, MD                             Medical Decision Making Amount and/or Complexity of Data Reviewed Labs: ordered. Radiology: ordered.  Risk Prescription drug management.   This patient presents to the ED with concern for epigastric discomfort, fatigue. This involves an extensive  number of treatment options, and is a complaint that carries with it a high risk of complications and morbidity.  The differential diagnosis includes atypical ACS versus anemia versus gastritis versus biliary disease versus pancreatitis versus other  Co-morbidities that complicate the patient evaluation: Several cardiovascular risk factors, including diabetes and high cholesterol and prior coronary disease  Currently in the ER, he is not having any discomfort  Additional history obtained from the patient's wife at bedside  External records from outside source obtained and reviewed including Nov 2014 surgery note for bypass x 2 LIMA, EF 55%   I ordered and personally interpreted labs.  The pertinent results include: No emergent findings.  I ordered imaging studies including x-ray of the chest; right upper quadrant ultrasound I independently visualized and interpreted imaging which showed no emergent findings -gallstone noted without acute stigmata of cholecystitis, liver mass consistent with benign hemangioma noted. I agree with the radiologist interpretation  The patient was maintained on a cardiac monitor.  I personally viewed and interpreted the cardiac monitored which showed an underlying rhythm of: Sinus rhythm with occasional PVCs  Per my interpretation the patient's ECG shows left bundle branch block, no prior EKG for comparison  I ordered medication including Zofran for nausea.  I have reviewed the patients home medicines and have made adjustments as needed  Test Considered: Low suspicion for acute PE  After the interventions noted above, I reevaluated the patient and found that they have: improved  Dispostion:  After consideration of the diagnostic results and the patients response to treatment, I feel that the patent would benefit from close outpatient follow-up.         Final Clinical Impression(s) / ED Diagnoses Final diagnoses:  Nausea and vomiting, unspecified  vomiting type  Calculus of gallbladder without cholecystitis without obstruction  Renal cyst  Renal mass    Rx / DC Orders ED Discharge Orders          Ordered    ondansetron (ZOFRAN-ODT) 4 MG disintegrating tablet  Every 8 hours PRN        10/09/22 1919              Wyvonnia Dusky, MD 10/09/22 2349

## 2022-10-09 NOTE — ED Triage Notes (Signed)
Mid abd pain since his morning , dry heaves no emesis . Denies diarrhea ,

## 2022-10-09 NOTE — ED Notes (Signed)
Pt c/o nausea, MD notified

## 2022-10-09 NOTE — ED Notes (Signed)
Pt returned from CT °

## 2022-10-09 NOTE — Discharge Instructions (Addendum)
Please return to the ER if you have new or worsening chest pain, lightheadedness, feel like passing out, or any other emergency concerns.  I printed copies of your CT scan which had some incidental findings, including a gallstone.  Please take the CT reports and your lab test with you to your next primary care doctor's appointment.    Your CT showed that you do have cysts in your kidneys and a possible soft tissue mass.  Your doctor should be aware of this, as there is a possibility this could be cancer as well.

## 2022-10-12 LAB — CBG MONITORING, ED: Glucose-Capillary: 144 mg/dL — ABNORMAL HIGH (ref 70–99)

## 2023-02-04 DIAGNOSIS — N2889 Other specified disorders of kidney and ureter: Secondary | ICD-10-CM | POA: Diagnosis not present

## 2023-03-04 DIAGNOSIS — M5416 Radiculopathy, lumbar region: Secondary | ICD-10-CM | POA: Diagnosis not present

## 2023-12-03 DIAGNOSIS — M5187 Other intervertebral disc disorders, lumbosacral region: Secondary | ICD-10-CM | POA: Diagnosis not present

## 2023-12-03 DIAGNOSIS — M545 Low back pain, unspecified: Secondary | ICD-10-CM | POA: Diagnosis not present

## 2024-04-18 ENCOUNTER — Encounter (HOSPITAL_BASED_OUTPATIENT_CLINIC_OR_DEPARTMENT_OTHER): Payer: Self-pay | Admitting: Emergency Medicine

## 2024-04-18 ENCOUNTER — Emergency Department (HOSPITAL_BASED_OUTPATIENT_CLINIC_OR_DEPARTMENT_OTHER): Admission: EM | Admit: 2024-04-18 | Discharge: 2024-04-18 | Disposition: A | Discharge status: Home / Self Care

## 2024-04-18 ENCOUNTER — Other Ambulatory Visit: Payer: Self-pay

## 2024-04-18 DIAGNOSIS — Z7982 Long term (current) use of aspirin: Secondary | ICD-10-CM | POA: Diagnosis not present

## 2024-04-18 DIAGNOSIS — G8929 Other chronic pain: Secondary | ICD-10-CM | POA: Diagnosis not present

## 2024-04-18 DIAGNOSIS — M5441 Lumbago with sciatica, right side: Secondary | ICD-10-CM | POA: Insufficient documentation

## 2024-04-18 DIAGNOSIS — M5442 Lumbago with sciatica, left side: Secondary | ICD-10-CM | POA: Diagnosis not present

## 2024-04-18 MED ORDER — OXYCODONE-ACETAMINOPHEN 5-325 MG PO TABS
1.0000 | ORAL_TABLET | Freq: Once | ORAL | Status: AC
  Administered 2024-04-18: 1 via ORAL
  Filled 2024-04-18: qty 1

## 2024-04-18 MED ORDER — HYDROCODONE-ACETAMINOPHEN 5-325 MG PO TABS: 2.0000 | ORAL_TABLET | Freq: Four times a day (QID) | ORAL | 0 refills | Status: DC | PRN

## 2024-04-18 MED ORDER — PREDNISONE 10 MG (21) PO TBPK: ORAL_TABLET | Freq: Every day | ORAL | 0 refills | Status: DC

## 2024-04-18 MED ORDER — METHOCARBAMOL 500 MG PO TABS: 500.0000 mg | ORAL_TABLET | Freq: Four times a day (QID) | ORAL | 0 refills | Status: DC | PRN

## 2024-04-18 MED ORDER — KETOROLAC TROMETHAMINE 15 MG/ML IJ SOLN
15.0000 mg | Freq: Once | INTRAMUSCULAR | Status: AC
  Administered 2024-04-18: 15 mg via INTRAMUSCULAR
  Filled 2024-04-18: qty 1

## 2024-04-18 NOTE — ED Provider Notes (Signed)
 [place] [facility] Provider Note   CSN: [id number] Arrival date & time: 04/18/24  1004     Patient presents with: Back Pain   Ryan Bridges is a 83 y.o. male.   83 year old male for evaluation of back pain.  It has been chronic and going on for few months but over the last week has gotten worse.  States he has developed some bilateral sciatica.  It has become difficult for him to walk secondary to pain.  He is taking tramadol and tizanidine which help much help at home.  Does take meloxicam as well.  States he was advised to come in by someone and request a possible MRI.  States he had a x-ray about a week ago that showed no worsening of his lumbar spine x-ray when it was performed.   Back Pain Associated symptoms: no abdominal pain, no chest pain, no dysuria and no fever        Prior to Admission medications   Medication Sig Start Date End Date Taking? Authorizing Provider  aspirin EC 81 MG tablet Take 81 mg by mouth daily.    [provider]  celecoxib  (CELEBREX ) 200 MG capsule Take 1 capsule (200 mg total) by mouth 2 (two) times daily. 03/14/18   Haviland, Julie, MD  Empagliflozin (JARDIANCE PO) Take by mouth.    [provider]  HYDROcodone -acetaminophen  (NORCO/VICODIN) 5-325 MG tablet Take 2 tablets by mouth every 6 (six) hours as needed for up to 4 days. 04/18/24 04/22/24 Yes Peggi Yono L, DO  insulin detemir (LEVEMIR) 100 UNIT/ML injection Inject into the skin daily.    [provider]  metFORMIN (GLUCOPHAGE) 1000 MG tablet Take 1,000 mg by mouth 2 (two) times daily with a meal.    [provider]  methocarbamol  (ROBAXIN ) 500 MG tablet Take 1 tablet (500 mg total) by mouth 4 (four) times daily as needed for up to 14 days for muscle spasms (pain). 04/18/24 05/02/24 Yes Laron Angelini L, DO  metoprolol succinate (TOPROL-XL) 12.5 mg TB24 24 hr tablet Take 12.5 mg by mouth daily.    [provider]   ondansetron  (ZOFRAN -ODT) 4 MG disintegrating tablet Take 1 tablet (4 mg total) by mouth every 8 (eight) hours as needed for up to 12 doses for nausea or vomiting. 10/09/22   Cottie Donnice PARAS, MD  predniSONE  (STERAPRED UNI-PAK 21 TAB) 10 MG (21) TBPK tablet Take by mouth daily. Take 6 tabs by mouth daily  for 2 days, then 5 tabs for 2 days, then 4 tabs for 2 days, then 3 tabs for 2 days, 2 tabs for 2 days, then 1 tab by mouth daily for 2 days 04/18/24  Yes Teondre Jarosz L, DO  Rosuvastatin Calcium (CRESTOR PO) Take by mouth.    [provider]  Semaglutide (OZEMPIC [place]) Inject into the skin.    [provider]  Semaglutide,0.25 or 0.5MG /DOS, (OZEMPIC, 0.25 OR 0.5 MG/DOSE,) 2 MG/1.5ML SOPN Ozempic  weekly    [provider]  sitaGLIPtin (JANUVIA) 50 MG tablet Take by mouth. 06/30/13   [provider]    Allergies: Benadryl [diphenhydramine]    Review of Systems  Constitutional:  Negative for chills and fever.  HENT:  Negative for ear pain and sore throat.   Eyes:  Negative for pain and visual disturbance.  Respiratory:  Negative for cough and shortness of breath.   Cardiovascular:  Negative for chest pain and palpitations.  Gastrointestinal:  Negative for abdominal pain  and vomiting.  Genitourinary:  Negative for dysuria and hematuria.  Musculoskeletal:  Positive for back pain. Negative for arthralgias.  Skin:  Negative for color change and rash.  Neurological:  Negative for seizures and syncope.  All other systems reviewed and are negative.   Updated Vital Signs BP (!) 156/61 (BP Location: Right Arm)   Pulse 80   Temp 97.7 F (36.5 C) (Oral)   Resp 18   Wt 92.1 kg   SpO2 96%   BMI 28.31 kg/m   Physical Exam Vitals and nursing note reviewed.  Constitutional:      General: He is not in acute distress.    Appearance: Normal appearance. He is well-developed. He is not ill-appearing.  HENT:     Head: Normocephalic and atraumatic.  Eyes:      Conjunctiva/sclera: Conjunctivae normal.  Cardiovascular:     Rate and Rhythm: Normal rate and regular rhythm.     Heart sounds: No murmur heard. Pulmonary:     Effort: Pulmonary effort is normal. No respiratory distress.     Breath sounds: Normal breath sounds.  Abdominal:     Palpations: Abdomen is soft.     Tenderness: There is no abdominal tenderness.  Musculoskeletal:        General: No swelling.     Cervical back: Neck supple.  Skin:    General: Skin is warm and dry.     Capillary Refill: Capillary refill takes less than 2 seconds.  Neurological:     Mental Status: He is alert.  Psychiatric:        Mood and Affect: Mood normal.     (all labs ordered are listed, but only abnormal results are displayed) Labs Reviewed - No data to display  EKG: None  Radiology: No results found.   Procedures   Medications Ordered in the ED  ketorolac  (TORADOL ) 15 MG/ML injection 15 mg (15 mg Intramuscular Given 04/18/24 1232)  oxyCODONE -acetaminophen  (PERCOCET/ROXICET) 5-325 MG per tablet 1 tablet (1 tablet Oral Given 04/18/24 1232)                                    Medical Decision Making Patient here for back pain, does not need an emergent MRI as he is able to ambulate does have a worsening of his sciatica.  We discussed his pain regimen I will have him stop tizanidine and tramadol and prescribe him Norco a Medrol  Dosepak and Robaxin  instead.  Advised to continue his meloxicam.  Advised [facility] and see if they can get an [facility] scheduled.  Advised return to the ER for new or worsening symptoms schedule being discharged.   Problems Addressed: Chronic bilateral low back pain with bilateral sciatica: chronic illness or injury with exacerbation, progression, or side effects of treatment  Amount and/or Complexity of Data Reviewed External Data Reviewed: notes.    Details: Outpatient records reviewed and patient follows up at the [facility] for his ongoing back  pain   Risk OTC drugs. Prescription drug management.     Final diagnoses:  Chronic bilateral low back pain with bilateral sciatica    ED Discharge Orders          Ordered    methocarbamol  (ROBAXIN ) 500 MG tablet  4 times daily PRN        04/18/24 1229    HYDROcodone -acetaminophen  (NORCO/VICODIN) 5-325 MG tablet  Every 6 hours PRN  04/18/24 1229    predniSONE  (STERAPRED UNI-PAK 21 TAB) 10 MG (21) TBPK tablet  Daily        04/18/24 1229               Gennaro Duwaine CROME, DO 04/18/24 1233

## 2024-04-18 NOTE — ED Triage Notes (Signed)
 Recurrent lower back radiating to bilateral legs . Sent here for possible MRI testing and further evaluation . No continence issues

## 2024-04-18 NOTE — Discharge Instructions (Addendum)
 Call the [facility] and make a follow-up appointment with them.  See if they can help get an MRI scheduled for you.  Also discussed with him to follow-up with pain management.  Discontinue your tramadol if it is not helping.  You can use your Norco as needed for pain as well as continuing your meloxicam.  Stop your tizanidine as well.  Start taking your Robaxin  up to 4 times a day as needed and your steroids as prescribed.  Note that steroids can raise your blood glucose.  Keep a close eye on this as well.  Return to the ER for any new or worsening symptoms.

## 2024-04-20 ENCOUNTER — Telehealth (HOSPITAL_BASED_OUTPATIENT_CLINIC_OR_DEPARTMENT_OTHER): Payer: Self-pay | Admitting: Student

## 2024-04-20 MED ORDER — METHOCARBAMOL 500 MG PO TABS: 500.0000 mg | ORAL_TABLET | Freq: Four times a day (QID) | ORAL | 0 refills | Status: AC | PRN

## 2024-04-20 MED ORDER — PREDNISONE 10 MG (21) PO TBPK: ORAL_TABLET | Freq: Every day | ORAL | 0 refills | Status: AC

## 2024-04-20 MED ORDER — HYDROCODONE-ACETAMINOPHEN 5-325 MG PO TABS: 1.0000 | ORAL_TABLET | Freq: Four times a day (QID) | ORAL | 0 refills | Status: AC | PRN

## 2024-04-20 NOTE — Telephone Encounter (Cosign Needed)
 Patient called ED to have his prescriptions from the ED changes to the [facility] pharmacy. Prescriptions sent.

## 2024-06-07 ENCOUNTER — Emergency Department (HOSPITAL_BASED_OUTPATIENT_CLINIC_OR_DEPARTMENT_OTHER)

## 2024-06-07 ENCOUNTER — Other Ambulatory Visit: Payer: Self-pay

## 2024-06-07 ENCOUNTER — Emergency Department (HOSPITAL_BASED_OUTPATIENT_CLINIC_OR_DEPARTMENT_OTHER)
Admission: EM | Admit: 2024-06-07 | Discharge: 2024-06-08 | Disposition: A | Discharge status: Home / Self Care | Attending: Emergency Medicine | Admitting: Emergency Medicine

## 2024-06-07 DIAGNOSIS — Z79899 Other long term (current) drug therapy: Secondary | ICD-10-CM | POA: Diagnosis not present

## 2024-06-07 DIAGNOSIS — I10 Essential (primary) hypertension: Secondary | ICD-10-CM | POA: Insufficient documentation

## 2024-06-07 DIAGNOSIS — R42 Dizziness and giddiness: Secondary | ICD-10-CM

## 2024-06-07 DIAGNOSIS — Z7982 Long term (current) use of aspirin: Secondary | ICD-10-CM | POA: Diagnosis not present

## 2024-06-07 LAB — BASIC METABOLIC PANEL WITH GFR
Anion gap: 19 — ABNORMAL HIGH (ref 5–15)
BUN: 28 mg/dL — ABNORMAL HIGH (ref 8–23)
CO2: 17 mmol/L — ABNORMAL LOW (ref 22–32)
Calcium: 10 mg/dL (ref 8.9–10.3)
Chloride: 100 mmol/L (ref 98–111)
Creatinine, Ser: 1.25 mg/dL — ABNORMAL HIGH (ref 0.61–1.24)
GFR, Estimated: 58 mL/min — ABNORMAL LOW (ref >60)
Glucose, Bld: 202 mg/dL — ABNORMAL HIGH (ref 70–99)
Potassium: 4.6 mmol/L (ref 3.5–5.1)
Sodium: 137 mmol/L (ref 135–145)

## 2024-06-07 LAB — TROPONIN T, HIGH SENSITIVITY
Troponin T High Sensitivity: 17 ng/L (ref 0–19)
Troponin T High Sensitivity: 15 ng/L (ref 0–19)

## 2024-06-07 LAB — CBC
HCT: 44.8 % (ref 39.0–52.0)
Hemoglobin: 15.1 g/dL (ref 13.0–17.0)
MCH: 30.1 pg (ref 26.0–34.0)
MCHC: 33.7 g/dL (ref 30.0–36.0)
MCV: 89.2 fL (ref 80.0–100.0)
Platelets: 288 K/uL (ref 150–400)
RBC: 5.02 MIL/uL (ref 4.22–5.81)
RDW: 14.1 % (ref 11.5–15.5)
WBC: 9.6 K/uL (ref 4.0–10.5)
nRBC: 0 % (ref 0.0–0.2)

## 2024-06-07 NOTE — ED Notes (Signed)
 Arrived to room no patient found at this time.

## 2024-06-07 NOTE — ED Provider Notes (Signed)
 [place] [facility] Provider Note   CSN: [id number] Arrival date & time: 06/07/24  2002     Patient presents with: No chief complaint on file.   Ryan Bridges is a 83 y.o. male.  {Add pertinent medical, surgical, social history, OB history to YEP:67052} Presents to the emergency department for evaluation of elevated blood pressure.  Patient had orthopedic procedures at the [facility] today including injections in his piriformis and an epidural injection.  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  Blood pressure was very elevated after the procedure.  Patient reports that his blood pressure has remained elevated throughout the day.  Normal blood pressure is 130-140/50-60.  Patient indicates a fullness to his head and mild dizziness.       Prior to Admission medications   Medication Sig Start Date End Date Taking? Authorizing Provider  aspirin EC 81 MG tablet Take 81 mg by mouth daily.    [provider]  celecoxib  (CELEBREX ) 200 MG capsule Take 1 capsule (200 mg total) by mouth 2 (two) times daily. 03/14/18   Haviland, Julie, MD  Empagliflozin (JARDIANCE PO) Take by mouth.    [provider]  HYDROcodone -acetaminophen  (NORCO/VICODIN) 5-325 MG tablet Take 1 tablet by mouth every 6 (six) hours as needed. 04/20/24   Aberman, Caroline C, PA-C  insulin detemir (LEVEMIR) 100 UNIT/ML injection Inject into the skin daily.    [provider]  metFORMIN (GLUCOPHAGE) 1000 MG tablet Take 1,000 mg by mouth 2 (two) times daily with a meal.    [provider]  metoprolol succinate (TOPROL-XL) 12.5 mg TB24 24 hr tablet Take 12.5 mg by mouth daily.    [provider]  ondansetron  (ZOFRAN -ODT) 4 MG disintegrating tablet Take 1 tablet (4 mg total) by mouth every 8 (eight) hours as needed for up to 12 doses for nausea or vomiting. 10/09/22   Cottie Donnice PARAS, MD  predniSONE   (STERAPRED UNI-PAK 21 TAB) 10 MG (21) TBPK tablet Take by mouth daily. Take 6 tabs by mouth daily  for 2 days, then 5 tabs for 2 days, then 4 tabs for 2 days, then 3 tabs for 2 days, 2 tabs for 2 days, then 1 tab by mouth daily for 2 days 04/20/24   Lorelle Fitch C, PA-C  Rosuvastatin Calcium (CRESTOR PO) Take by mouth.    [provider]  Semaglutide (OZEMPIC [place]) Inject into the skin.    [provider]  Semaglutide,0.25 or 0.5MG /DOS, (OZEMPIC, 0.25 OR 0.5 MG/DOSE,) 2 MG/1.5ML SOPN Ozempic  weekly    [provider]  sitaGLIPtin (JANUVIA) 50 MG tablet Take by mouth. 06/30/13   [provider]    Allergies: Benadryl [diphenhydramine]    Review of Systems  Updated Vital Signs BP (!) 178/89 (BP Location: Left Arm)   Pulse (!) 106   Temp 98.4 F (36.9 C) (Oral)   Resp 16   Ht 5' 11 (1.803 m)   Wt 87.1 kg   SpO2 100%   BMI 26.78 kg/m   Physical Exam Vitals and nursing note reviewed.  Constitutional:      General: He is not in acute distress.    Appearance: He is well-developed.  HENT:     Head: Normocephalic and atraumatic.     Mouth/Throat:     Mouth: Mucous membranes are moist.  Eyes:     General: Vision grossly intact. Gaze aligned appropriately.     Extraocular  Movements: Extraocular movements intact.     Conjunctiva/sclera: Conjunctivae normal.  Cardiovascular:     Rate and Rhythm: Normal rate and regular rhythm.     Pulses: Normal pulses.     Heart sounds: Normal heart sounds, S1 normal and S2 normal. No murmur heard.    No friction rub. No gallop.  Pulmonary:     Effort: Pulmonary effort is normal. No respiratory distress.     Breath sounds: Normal breath sounds.  Abdominal:     Palpations: Abdomen is soft.     Tenderness: There is no abdominal tenderness. There is no guarding or rebound.     Hernia: No hernia is present.  Musculoskeletal:        General: No swelling.     Cervical back: Full passive range of motion without  pain, normal range of motion and neck supple. No pain with movement, spinous process tenderness or muscular tenderness. Normal range of motion.     Right lower leg: No edema.     Left lower leg: No edema.  Skin:    General: Skin is warm and dry.     Capillary Refill: Capillary refill takes less than 2 seconds.     Findings: No ecchymosis, erythema, lesion or wound.  Neurological:     Mental Status: He is alert and oriented to person, place, and time.     GCS: GCS eye subscore is 4. GCS verbal subscore is 5. GCS motor subscore is 6.     Cranial Nerves: Cranial nerves 2-12 are intact.     Sensory: Sensation is intact.     Motor: Motor function is intact. No weakness or abnormal muscle tone.     Coordination: Coordination is intact.     Comments: Extraocular muscle movement: normal No visual field cut Pupils: equal and reactive both direct and consensual response is normal No nystagmus present    Sensory function is intact to light touch, pinprick Proprioception intact  Grip strength 5/5 symmetric in upper extremities No pronator drift Normal finger to nose bilaterally  Lower extremity strength 5/5 against gravity Normal heel to shin bilaterally    Psychiatric:        Mood and Affect: Mood normal.        Speech: Speech normal.        Behavior: Behavior normal.     (all labs ordered are listed, but only abnormal results are displayed) Labs Reviewed  BASIC METABOLIC PANEL WITH GFR - Abnormal; Notable for the following components:      Result Value   CO2 17 (*)    Glucose, Bld 202 (*)    BUN 28 (*)    Creatinine, Ser 1.25 (*)    GFR, Estimated 58 (*)    Anion gap 19 (*)    All other components within normal limits  CBC  TROPONIN T, HIGH SENSITIVITY  TROPONIN T, HIGH SENSITIVITY    EKG: EKG Interpretation Date/Time:  Wednesday June 07 2024 20:14:22 EDT Ventricular Rate:  111 PR Interval:  149 QRS Duration:  144 QT Interval:  373 QTC Calculation: 507 R  Axis:   126  Text Interpretation: Sinus tachycardia Left bundle branch block Non-specific ST-t changes Confirmed by Bernard Drivers ([id number]) on 06/07/2024 8:34:04 PM  Radiology: DG Chest 2 View Result Date: 06/07/2024 EXAM: 2 VIEW(S) XRAY OF THE CHEST 06/07/2024 08:39:00 PM COMPARISON: CT 3 / 4 / 20 CLINICAL HISTORY: HTN and dizziness. Went to the [facility] today and got an epidural and injections. His b/p  was elevated and has stayed elevated. +dizziness and head feels 'clogged up'. Performed xrays in wheelchair. FINDINGS: LUNGS AND PLEURA: No focal pulmonary opacity. No pulmonary edema. No pleural effusion. No pneumothorax. HEART AND MEDIASTINUM: CABG markers present. No acute abnormality of the cardiac and mediastinal silhouettes. BONES AND SOFT TISSUES: Sternotomy wires present. No acute osseous abnormality. IMPRESSION: 1. No acute cardiopulmonary process. Electronically signed by: Norman Gatlin MD 06/07/2024 08:45 PM EDT RP Workstation: [id number]    {Document cardiac monitor, telemetry assessment procedure when appropriate:32947} Procedures   Medications Ordered in the ED - No data to display    {Click here for ABCD2, HEART and other calculators REFRESH Note before signing:1}                              Medical Decision Making Amount and/or Complexity of Data Reviewed Labs: ordered. Radiology: ordered.   ***  {Document critical care time when appropriate  Document review of labs and clinical decision tools ie [id number], etc  Document your independent review of radiology images and any outside records  Document your discussion with family members, caretakers and with consultants  Document social determinants of health affecting pt's care  Document your decision making why or why not admission, treatments were needed:32947:::1}   Final diagnoses:  None    ED Discharge Orders     None

## 2024-06-07 NOTE — ED Triage Notes (Signed)
 Went to the [facility] today and got an epidural and injections. His b/p was elevated and has stated elevated.  +dizziness and head feels 'clogged up'.

## 2024-06-08 NOTE — ED Notes (Signed)
 Denies dizziness, notes symptoms are resolving for the most part, just expresses concern for a higher then his normal BP reading
